# Patient Record
Sex: Male | Born: 1940 | Race: White | Hispanic: No | Marital: Married | Smoking: Former smoker
Health system: Southern US, Community
[De-identification: ages and names within clinical notes are randomized; demographics above are authoritative.]

## PROBLEM LIST (undated history)

## (undated) DIAGNOSIS — E785 Hyperlipidemia, unspecified: Secondary | ICD-10-CM

## (undated) DIAGNOSIS — Z87442 Personal history of urinary calculi: Secondary | ICD-10-CM

## (undated) DIAGNOSIS — H409 Unspecified glaucoma: Secondary | ICD-10-CM

## (undated) DIAGNOSIS — Z9889 Other specified postprocedural states: Secondary | ICD-10-CM

## (undated) DIAGNOSIS — I714 Abdominal aortic aneurysm, without rupture, unspecified: Secondary | ICD-10-CM

## (undated) DIAGNOSIS — R112 Nausea with vomiting, unspecified: Secondary | ICD-10-CM

## (undated) DIAGNOSIS — Z974 Presence of external hearing-aid: Secondary | ICD-10-CM

## (undated) DIAGNOSIS — H269 Unspecified cataract: Secondary | ICD-10-CM

## (undated) DIAGNOSIS — H9190 Unspecified hearing loss, unspecified ear: Secondary | ICD-10-CM

## (undated) DIAGNOSIS — I729 Aneurysm of unspecified site: Secondary | ICD-10-CM

## (undated) HISTORY — DX: Unspecified hearing loss, unspecified ear: H91.90

## (undated) HISTORY — DX: Abdominal aortic aneurysm, without rupture, unspecified: I71.40

## (undated) HISTORY — PX: SHOULDER ARTHROSCOPY: SHX128

## (undated) HISTORY — DX: Unspecified glaucoma: H40.9

## (undated) HISTORY — DX: Hyperlipidemia, unspecified: E78.5

## (undated) HISTORY — DX: Aneurysm of unspecified site: I72.9

## (undated) HISTORY — PX: INGUINAL HERNIA REPAIR: SUR1180

## (undated) HISTORY — DX: Unspecified cataract: H26.9

---

## 1978-01-29 HISTORY — PX: INGUINAL HERNIA REPAIR: SUR1180

## 2005-01-29 HISTORY — PX: JOINT REPLACEMENT: SHX530

## 2005-08-29 ENCOUNTER — Emergency Department: Payer: Self-pay | Admitting: Emergency Medicine

## 2007-12-24 ENCOUNTER — Ambulatory Visit: Payer: Self-pay | Admitting: Urology

## 2007-12-29 ENCOUNTER — Ambulatory Visit: Payer: Self-pay | Admitting: Urology

## 2008-01-08 ENCOUNTER — Ambulatory Visit: Payer: Self-pay | Admitting: Urology

## 2008-03-03 ENCOUNTER — Ambulatory Visit: Payer: Self-pay | Admitting: Urology

## 2008-04-07 ENCOUNTER — Ambulatory Visit: Payer: Self-pay | Admitting: Urology

## 2009-10-09 IMAGING — CR DG ABDOMEN 1V
1 series · 1 of 1 positions shown · non-contrast
Comparison: none

REASON FOR EXAM: pre eswl flank pain right
COMMENTS:

[view not recorded]
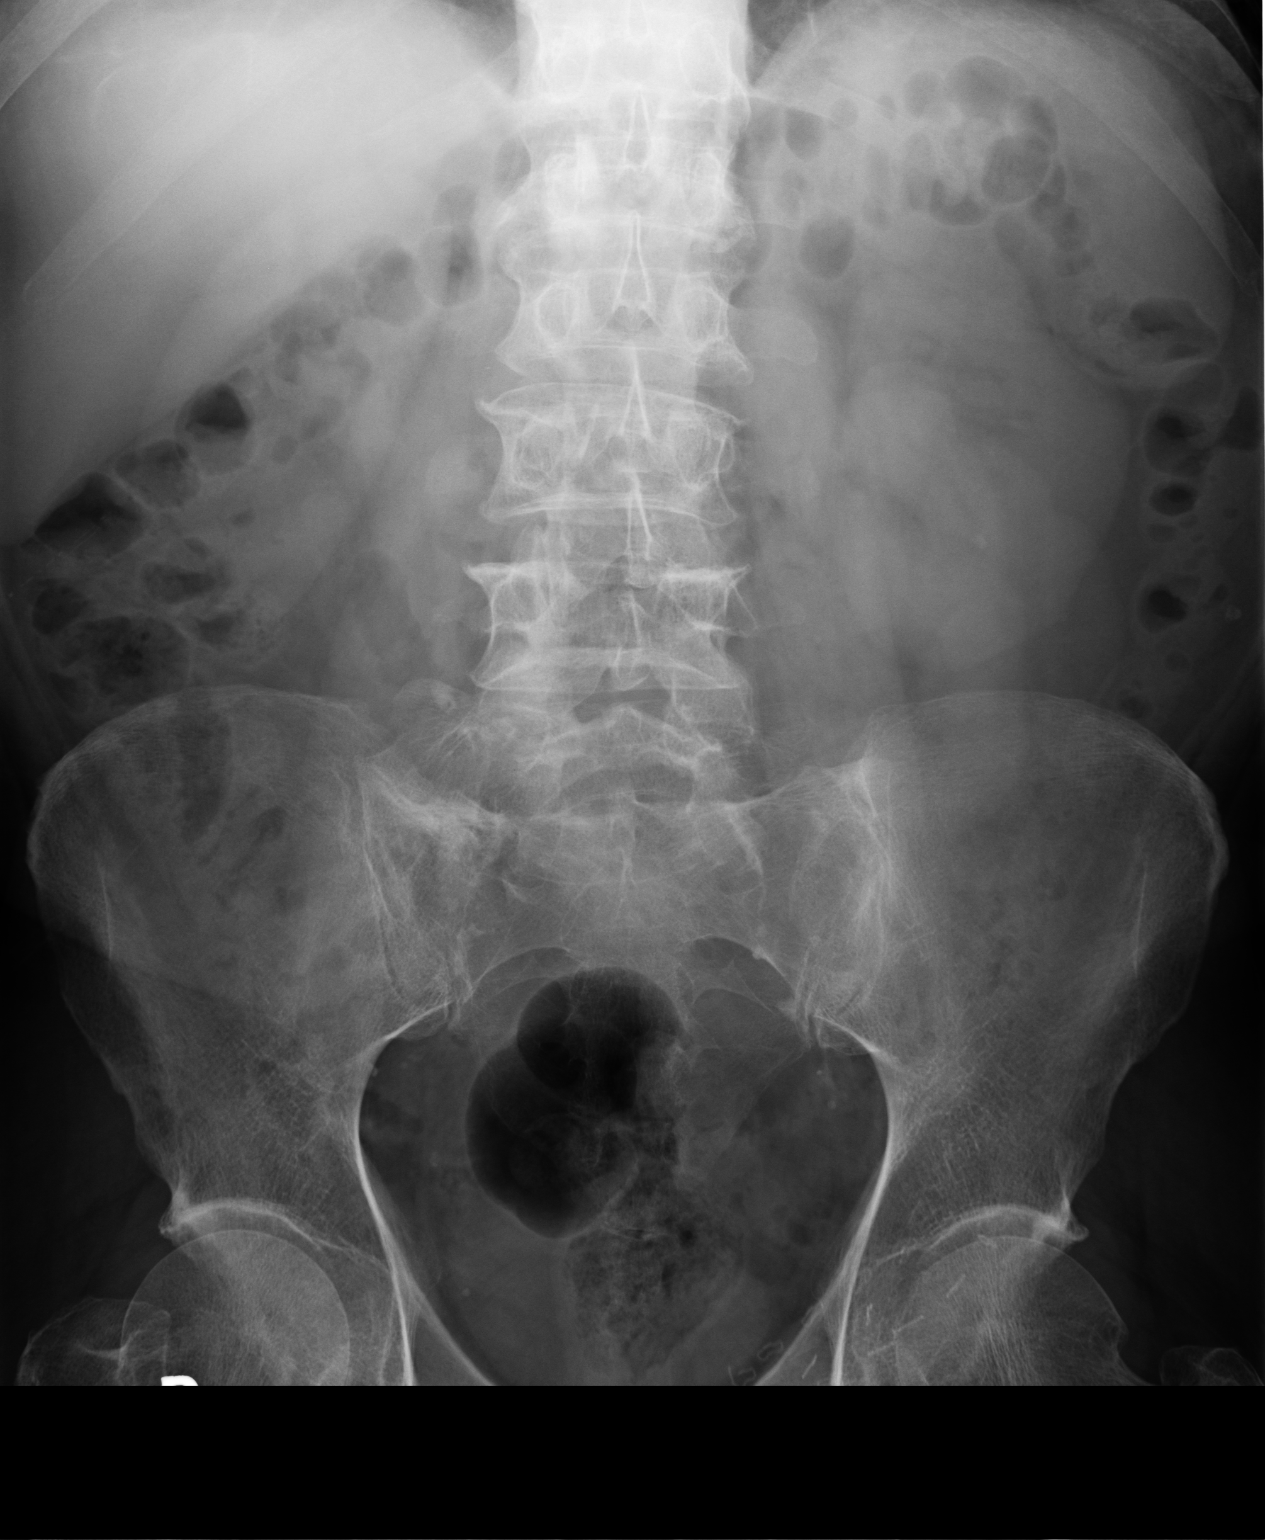

[1 of 1 positions shown; findings below may reference images not displayed]

PROCEDURE:     DXR - DXR KIDNEY URETER BLADDER  - January 08, 2008 [DATE]

RESULT:     Comparison is made to prior exam of 12/29/2007. The current exam
shows a tiny density projected over the lower pole of the LEFT kidney
compatible a LEFT renal stone. On the RIGHT, there is a 7 mm density
projected lateral to the L5 lumbar vertebral body on the RIGHT and
consistent with a persistent RIGHT ureteral stone. No other ureteral stones
are seen. There are possibly 1 or 2 tiny calcifications of the RIGHT kidney
but these are better seen at prior CT.
IMPRESSION: 1. Persistent density lateral to the L5 lumbar vertebral body on the RIGHT
and consistent with a RIGHT ureteral stone.
2. LEFT nephrolithiasis.
3. Possible RIGHT nephrolithiasis.

## 2010-12-13 ENCOUNTER — Ambulatory Visit: Payer: Self-pay | Admitting: Internal Medicine

## 2011-01-30 HISTORY — PX: EYE SURGERY: SHX253

## 2011-10-26 ENCOUNTER — Ambulatory Visit: Payer: Self-pay | Admitting: Internal Medicine

## 2012-04-02 ENCOUNTER — Ambulatory Visit: Payer: Self-pay | Admitting: Surgery

## 2012-04-02 LAB — CREATININE, SERUM
Creatinine: 0.98 mg/dL (ref 0.60–1.30)
EGFR (Non-African Amer.): >60

## 2013-05-08 ENCOUNTER — Ambulatory Visit: Payer: Self-pay | Admitting: Internal Medicine

## 2014-07-29 ENCOUNTER — Encounter: Payer: Self-pay | Admitting: Surgery

## 2014-07-29 ENCOUNTER — Ambulatory Visit (INDEPENDENT_AMBULATORY_CARE_PROVIDER_SITE_OTHER): Payer: Medicare HMO | Admitting: Surgery

## 2014-07-29 VITALS — BP 145/82 | HR 85 | Temp 98.2°F | Ht 68.0 in | Wt 180.2 lb

## 2014-07-29 DIAGNOSIS — K4090 Unilateral inguinal hernia, without obstruction or gangrene, not specified as recurrent: Secondary | ICD-10-CM | POA: Diagnosis not present

## 2014-07-29 NOTE — Patient Instructions (Signed)
You have requested to have your Right Hernia Repaired. We will need to have medical clearance completed by your PCP (Dr. Maryellen PileEason) prior to being scheduled. We will call you with appointment information.  After clearance has been obtained, our office will call you with your surgery and pre-op appointment information.

## 2014-07-29 NOTE — Progress Notes (Deleted)
Subjective:     Patient ID: Jason Reese, male   DOB: 01/14/1941, 74 y.o.   MRN: [id number]  HPI   Review of Systems     Objective:   Physical Exam     Assessment:     ***    Plan:     ***

## 2014-07-29 NOTE — H&P (Signed)
CC: Right groin pain x 8 months HPI: [name] is a pleasant 74 yo M who presents with an 8 month worsening right groin pain and swelling.  Has had a hernia there for years.  History of LIH repair x 2 in past.  Nonreducible due to significant size of hernia.  Otherwise no fevers/chills, night sweats, shortness of breath, cough, chest pain, abdominal pain, nausea/vomiting, diarrhea/constipation, dysuria/hematuria.  H/o AAA, unsure who is following.    Active Ambulatory Problems    Diagnosis Date Noted  . Inguinal hernia 07/29/2014   Resolved Ambulatory Problems    Diagnosis Date Noted  . No Resolved Ambulatory Problems   Past Medical History  Diagnosis Date  . Aneurysm   . Hyperlipidemia   . Glaucoma   . Cataract   . Hard of hearing    No Known Allergies     Medication List       This list is accurate as of: 07/29/14 11:40 AM.  Always use your most recent med list.               AZOPT 1 % ophthalmic suspension  Generic drug:  brinzolamide  Apply 1 drop to eye 2 (two) times daily.     latanoprost 0.005 % ophthalmic solution  Commonly known as:  XALATAN  Place 1 drop into both eyes at bedtime.     lovastatin 20 MG tablet  Commonly known as:  MEVACOR  Take 1 tablet by mouth daily.       History   Social History  . Marital Status: Married    Spouse Name: N/A  . Number of Children: N/A  . Years of Education: N/A   Occupational History  . Not on file.   Social History Main Topics  . Smoking status: Former Smoker    Quit date: 07/28/2005  . Smokeless tobacco: Never Used  . Alcohol Use: No  . Drug Use: No  . Sexual Activity: Not on file   Other Topics Concern  . Not on file   Social History Narrative  . No narrative on file   Family History  Problem Relation Age of Onset  . Diabetes Mother    Blood pressure 145/82, pulse 85, temperature 98.2 F (36.8 C), temperature source Oral, height 5\' 8"  (1.727 m), weight 180 lb 3.2 oz (81.738 kg). GEN:  NAD/A&Ox3 FACE: no obvious facial trauma, normal external nose, normal external ears EYES: no scleral icterus, no conjunctivitis HEAD: normocephalic atraumatic CV: RRR, no MRG RESP: moving air well, lungs clear ABD: soft, nontender, nondistended GROIN: Large, nonreducible right inguinal hernia extending into right groin EXT: moving all ext well, strength 5/5  Lab: no new labs Imaging: no new imaging  A/P 74 yo M who presents with worsening, increasingly painful RIH.  Would like it repaired.  Likely too large to repair laparoscopically, will plan on open RIH repair.  I have discussed the risks and benefits of surgery including possible testicular loss and he would like to proceed.  Will obtain PCP clearance with regards to AAA

## 2014-07-29 NOTE — Progress Notes (Signed)
CC: Right groin pain x 8 months HPI: [name] is a pleasant 74 yo M who presents with an 8 month worsening right groin pain and swelling.  Has had a hernia there for years.  History of LIH repair x 2 in past.  Nonreducible due to significant size of hernia.  Otherwise no fevers/chills, night sweats, shortness of breath, cough, chest pain, abdominal pain, nausea/vomiting, diarrhea/constipation, dysuria/hematuria.  H/o AAA, unsure who is following.    Active Ambulatory Problems    Diagnosis Date Noted  . Inguinal hernia 07/29/2014   Resolved Ambulatory Problems    Diagnosis Date Noted  . No Resolved Ambulatory Problems   Past Medical History  Diagnosis Date  . Aneurysm   . Hyperlipidemia   . Glaucoma   . Cataract   . Hard of hearing    No Known Allergies     Medication List       This list is accurate as of: 07/29/14 11:40 AM.  Always use your most recent med list.               AZOPT 1 % ophthalmic suspension  Generic drug:  brinzolamide  Apply 1 drop to eye 2 (two) times daily.     latanoprost 0.005 % ophthalmic solution  Commonly known as:  XALATAN  Place 1 drop into both eyes at bedtime.     lovastatin 20 MG tablet  Commonly known as:  MEVACOR  Take 1 tablet by mouth daily.       History   Social History  . Marital Status: Married    Spouse Name: N/A  . Number of Children: N/A  . Years of Education: N/A   Occupational History  . Not on file.   Social History Main Topics  . Smoking status: Former Smoker    Quit date: 07/28/2005  . Smokeless tobacco: Never Used  . Alcohol Use: No  . Drug Use: No  . Sexual Activity: Not on file   Other Topics Concern  . Not on file   Social History Narrative  . No narrative on file   Family History  Problem Relation Age of Onset  . Diabetes Mother    Blood pressure 145/82, pulse 85, temperature 98.2 F (36.8 C), temperature source Oral, height 5\' 8"  (1.727 m), weight 180 lb 3.2 oz (81.738 kg). GEN:  NAD/A&Ox3 FACE: no obvious facial trauma, normal external nose, normal external ears EYES: no scleral icterus, no conjunctivitis HEAD: normocephalic atraumatic CV: RRR, no MRG RESP: moving air well, lungs clear ABD: soft, nontender, nondistended GROIN: Large, nonreducible right inguinal hernia extending into right groin EXT: moving all ext well, strength 5/5  Lab: no new labs Imaging: no new imaging  A/P 74 yo M who presents with worsening, increasingly painful RIH.  Would like it repaired.  Likely too large to repair laparoscopically, will plan on open RIH repair.  I have discussed the risks and benefits of surgery including possible testicular loss and he would like to proceed. NEURO: cnII-XII grossly intact, sensation intact all 4 ext

## 2014-08-11 ENCOUNTER — Encounter: Payer: Self-pay | Admitting: Surgery

## 2014-08-12 ENCOUNTER — Telehealth: Payer: Self-pay | Admitting: Surgery

## 2014-08-12 NOTE — Telephone Encounter (Signed)
FYI:  I put a Medical Clearance for Surgery to the pt chart under Media from Dr. Maryellen PileEason.

## 2014-08-13 ENCOUNTER — Encounter: Payer: Self-pay | Admitting: Surgery

## 2014-08-18 ENCOUNTER — Ambulatory Visit
Admission: RE | Admit: 2014-08-18 | Discharge: 2014-08-18 | Disposition: A | Discharge status: Home / Self Care | Payer: Medicare HMO | Source: Ambulatory Visit | Attending: Surgery | Admitting: Surgery

## 2014-08-18 ENCOUNTER — Encounter
Admission: RE | Admit: 2014-08-18 | Discharge: 2014-08-18 | Disposition: A | Discharge status: Home / Self Care | Payer: Medicare HMO | Source: Ambulatory Visit | Attending: Surgery | Admitting: Surgery

## 2014-08-18 DIAGNOSIS — Z01818 Encounter for other preprocedural examination: Secondary | ICD-10-CM | POA: Diagnosis not present

## 2014-08-18 DIAGNOSIS — K449 Diaphragmatic hernia without obstruction or gangrene: Secondary | ICD-10-CM | POA: Insufficient documentation

## 2014-08-18 HISTORY — DX: Other specified postprocedural states: R11.2

## 2014-08-18 HISTORY — DX: Other specified postprocedural states: Z98.890

## 2014-08-18 LAB — COMPREHENSIVE METABOLIC PANEL
ALT: 13 U/L — ABNORMAL LOW (ref 17–63)
AST: 18 U/L (ref 15–41)
Albumin: 4.1 g/dL (ref 3.5–5.0)
Alkaline Phosphatase: 79 U/L (ref 38–126)
Anion gap: 8 (ref 5–15)
BILIRUBIN TOTAL: 0.7 mg/dL (ref 0.3–1.2)
BUN: 15 mg/dL (ref 6–20)
CALCIUM: 9.1 mg/dL (ref 8.9–10.3)
CHLORIDE: 110 mmol/L (ref 101–111)
CO2: 25 mmol/L (ref 22–32)
CREATININE: 0.73 mg/dL (ref 0.61–1.24)
GFR calc non Af Amer: >60 mL/min (ref >60)
Glucose, Bld: 102 mg/dL — ABNORMAL HIGH (ref 65–99)
POTASSIUM: 3.8 mmol/L (ref 3.5–5.1)
Sodium: 143 mmol/L (ref 135–145)
Total Protein: 7.1 g/dL (ref 6.5–8.1)

## 2014-08-18 LAB — CBC WITH DIFFERENTIAL/PLATELET
Basophils Absolute: 0.1 10*3/uL (ref 0–0.1)
Basophils Relative: 1 %
Eosinophils Absolute: 0.3 10*3/uL (ref 0–0.7)
Eosinophils Relative: 5 %
HCT: 44 % (ref 40.0–52.0)
HEMOGLOBIN: 14.2 g/dL (ref 13.0–18.0)
Lymphocytes Relative: 14 %
Lymphs Abs: 1 10*3/uL (ref 1.0–3.6)
MCH: 26.3 pg (ref 26.0–34.0)
MCHC: 32.2 g/dL (ref 32.0–36.0)
MCV: 81.6 fL (ref 80.0–100.0)
MONO ABS: 0.5 10*3/uL (ref 0.2–1.0)
Monocytes Relative: 7 %
Neutro Abs: 5.3 10*3/uL (ref 1.4–6.5)
Neutrophils Relative %: 73 %
Platelets: 147 10*3/uL — ABNORMAL LOW (ref 150–440)
RBC: 5.39 MIL/uL (ref 4.40–5.90)
RDW: 15.3 % — ABNORMAL HIGH (ref 11.5–14.5)
WBC: 7.2 10*3/uL (ref 3.8–10.6)

## 2014-08-18 NOTE — Telephone Encounter (Signed)
Auth obtained for CPT: 49650 eff date:7/20- 8/25 REF# [id number] Per: Francine GravenHUMANA

## 2014-08-18 NOTE — Patient Instructions (Signed)
  Your procedure is scheduled on: Wednesday August 25, 2014 Report to Same Day Surgery. To find out your arrival time please call [phone] between 1PM - 3PM on Tuesday August 24, 2014.  Remember: Instructions that are not followed completely may result in serious medical risk, up to and including death, or upon the discretion of your surgeon and anesthesiologist your surgery may need to be rescheduled.    __x__ 1. Do not eat food or drink liquids after midnight. No gum chewing or hard candies.     ____ 2. No Alcohol for 24 hours before or after surgery.   ____ 3. Bring all medications with you on the day of surgery if instructed.    __x__ 4. Notify your doctor if there is any change in your medical condition     (cold, fever, infections).     Do not wear jewelry, make-up, hairpins, clips or nail polish.  Do not wear lotions, powders, or perfumes. You may wear deodorant.  Do not shave 48 hours prior to surgery. Men may shave face and neck.  Do not bring valuables to the hospital.    [facility] is not responsible for any belongings or valuables.               Contacts, dentures or bridgework may not be worn into surgery.  Leave your suitcase in the car. After surgery it may be brought to your room.  For patients admitted to the hospital, discharge time is determined by your  treatment team.   Patients discharged the day of surgery will not be allowed to drive home.    Please read over the following fact sheets that you were given:   [facility] Pre[facility]  __x__ Take these medicines the morning of surgery with A SIP OF WATER:    1. lovastatin (MEVACOR)     ____ Fleet Enema (as directed)   _x___ Use CHG Soap as directed  ____ Use inhalers on the day of surgery  ____ Stop metformin 2 days prior to surgery    ____ Take 1/2 of usual insulin dose the night before surgery and none on the morning of surgery.   _x___ Stop B C Powders (aspirin) on today, Tylenol is OK  to take for pain.  ____ Stop Anti-inflammatories on does not apply.  ____ Stop supplements until after surgery.    ____ Bring C-Pap to the hospital.

## 2014-08-25 ENCOUNTER — Encounter: Payer: Self-pay | Admitting: Anesthesiology

## 2014-08-25 ENCOUNTER — Ambulatory Visit: Payer: Medicare HMO | Admitting: Anesthesiology

## 2014-08-25 ENCOUNTER — Ambulatory Visit
Admission: RE | Admit: 2014-08-25 | Discharge: 2014-08-25 | Disposition: A | Discharge status: Home / Self Care | Payer: Medicare HMO | Source: Ambulatory Visit | Attending: Surgery | Admitting: Surgery

## 2014-08-25 ENCOUNTER — Encounter
Admission: RE | Disposition: A | Discharge status: Home / Self Care | Payer: Self-pay | Source: Ambulatory Visit | Attending: Surgery

## 2014-08-25 DIAGNOSIS — K4090 Unilateral inguinal hernia, without obstruction or gangrene, not specified as recurrent: Secondary | ICD-10-CM | POA: Insufficient documentation

## 2014-08-25 DIAGNOSIS — H409 Unspecified glaucoma: Secondary | ICD-10-CM | POA: Insufficient documentation

## 2014-08-25 DIAGNOSIS — Z833 Family history of diabetes mellitus: Secondary | ICD-10-CM | POA: Diagnosis not present

## 2014-08-25 DIAGNOSIS — Z79899 Other long term (current) drug therapy: Secondary | ICD-10-CM | POA: Insufficient documentation

## 2014-08-25 DIAGNOSIS — Z87891 Personal history of nicotine dependence: Secondary | ICD-10-CM | POA: Diagnosis not present

## 2014-08-25 DIAGNOSIS — E785 Hyperlipidemia, unspecified: Secondary | ICD-10-CM | POA: Insufficient documentation

## 2014-08-25 DIAGNOSIS — H9190 Unspecified hearing loss, unspecified ear: Secondary | ICD-10-CM | POA: Insufficient documentation

## 2014-08-25 HISTORY — PX: INGUINAL HERNIA REPAIR: SHX194

## 2014-08-25 SURGERY — REPAIR, HERNIA, INGUINAL, ADULT
Anesthesia: General | Laterality: Right | Wound class: Clean

## 2014-08-25 MED ORDER — ESMOLOL HCL 10 MG/ML IV SOLN
INTRAVENOUS | Status: DC | PRN
  Administered 2014-08-25: 20 mg via INTRAVENOUS

## 2014-08-25 MED ORDER — LIDOCAINE-EPINEPHRINE 1 %-1:100000 IJ SOLN
INTRAMUSCULAR | Status: AC
  Filled 2014-08-25: qty 1

## 2014-08-25 MED ORDER — SODIUM CHLORIDE 0.9 % IJ SOLN
INTRAMUSCULAR | Status: AC
  Filled 2014-08-25: qty 10

## 2014-08-25 MED ORDER — MIDAZOLAM HCL 2 MG/2ML IJ SOLN
INTRAMUSCULAR | Status: DC | PRN
  Administered 2014-08-25: 2 mg via INTRAVENOUS

## 2014-08-25 MED ORDER — ONDANSETRON HCL 4 MG/2ML IJ SOLN
INTRAMUSCULAR | Status: DC | PRN
  Administered 2014-08-25: 4 mg via INTRAVENOUS

## 2014-08-25 MED ORDER — ACETAMINOPHEN 10 MG/ML IV SOLN
INTRAVENOUS | Status: DC | PRN
  Administered 2014-08-25: 1000 mg via INTRAVENOUS

## 2014-08-25 MED ORDER — PROMETHAZINE HCL 25 MG/ML IJ SOLN
INTRAMUSCULAR | Status: AC
  Administered 2014-08-25: 6.25 mg via INTRAVENOUS
  Filled 2014-08-25: qty 1

## 2014-08-25 MED ORDER — FENTANYL CITRATE (PF) 100 MCG/2ML IJ SOLN
INTRAMUSCULAR | Status: AC
  Administered 2014-08-25: 25 ug via INTRAVENOUS
  Filled 2014-08-25: qty 2

## 2014-08-25 MED ORDER — ACETAMINOPHEN 10 MG/ML IV SOLN
INTRAVENOUS | Status: AC
  Filled 2014-08-25: qty 100

## 2014-08-25 MED ORDER — GLYCOPYRROLATE 0.2 MG/ML IJ SOLN
INTRAMUSCULAR | Status: DC | PRN
Start: 2014-08-25 — End: 2014-08-25
  Administered 2014-08-25: .8 mg via INTRAVENOUS
  Administered 2014-08-25: .2 mg via INTRAVENOUS

## 2014-08-25 MED ORDER — OXYCODONE HCL 5 MG/5ML PO SOLN: 5.0000 mg | Freq: Once | ORAL | Status: DC | PRN

## 2014-08-25 MED ORDER — FAMOTIDINE 20 MG PO TABS
20.0000 mg | ORAL_TABLET | Freq: Once | ORAL | Status: AC
  Administered 2014-08-25: 20 mg via ORAL

## 2014-08-25 MED ORDER — BACITRACIN 50000 UNITS IM SOLR
INTRAMUSCULAR | Status: AC
  Filled 2014-08-25: qty 1

## 2014-08-25 MED ORDER — PROMETHAZINE HCL 25 MG/ML IJ SOLN
6.2500 mg | Freq: Once | INTRAMUSCULAR | Status: AC
  Administered 2014-08-25: 6.25 mg via INTRAVENOUS

## 2014-08-25 MED ORDER — ROCURONIUM BROMIDE 100 MG/10ML IV SOLN
INTRAVENOUS | Status: DC | PRN
  Administered 2014-08-25: 10 mg via INTRAVENOUS
  Administered 2014-08-25: 40 mg via INTRAVENOUS
  Administered 2014-08-25 (×2): 10 mg via INTRAVENOUS

## 2014-08-25 MED ORDER — CEFOXITIN SODIUM-DEXTROSE 2-2.2 GM-% IV SOLR (PREMIX)
INTRAVENOUS | Status: AC
  Administered 2014-08-25: 2 g via INTRAVENOUS
  Filled 2014-08-25: qty 50

## 2014-08-25 MED ORDER — FENTANYL CITRATE (PF) 100 MCG/2ML IJ SOLN
INTRAMUSCULAR | Status: DC | PRN
  Administered 2014-08-25: 50 ug via INTRAVENOUS
  Administered 2014-08-25: 100 ug via INTRAVENOUS
  Administered 2014-08-25: 50 ug via INTRAVENOUS

## 2014-08-25 MED ORDER — PROPOFOL 10 MG/ML IV BOLUS: INTRAVENOUS | Status: DC | PRN

## 2014-08-25 MED ORDER — LACTATED RINGERS IV SOLN
INTRAVENOUS | Status: DC
Start: 2014-08-25 — End: 2014-08-25
  Administered 2014-08-25 (×2): via INTRAVENOUS

## 2014-08-25 MED ORDER — CEFOXITIN SODIUM-DEXTROSE 2-2.2 GM-% IV SOLR (PREMIX)
2.0000 g | INTRAVENOUS | Status: AC
  Administered 2014-08-25: 2000 mg via INTRAVENOUS

## 2014-08-25 MED ORDER — OXYCODONE HCL 5 MG PO TABS: 5.0000 mg | ORAL_TABLET | Freq: Once | ORAL | Status: DC | PRN

## 2014-08-25 MED ORDER — ONDANSETRON HCL 4 MG/2ML IJ SOLN
INTRAMUSCULAR | Status: AC
  Administered 2014-08-25: 4 mg via INTRAVENOUS
  Filled 2014-08-25: qty 2

## 2014-08-25 MED ORDER — FAMOTIDINE 20 MG PO TABS
ORAL_TABLET | ORAL | Status: AC
  Administered 2014-08-25: 20 mg via ORAL
  Filled 2014-08-25: qty 1

## 2014-08-25 MED ORDER — EPHEDRINE SULFATE 50 MG/ML IJ SOLN
INTRAMUSCULAR | Status: DC | PRN
  Administered 2014-08-25 (×2): 10 mg via INTRAVENOUS

## 2014-08-25 MED ORDER — FENTANYL CITRATE (PF) 100 MCG/2ML IJ SOLN
25.0000 ug | INTRAMUSCULAR | Status: DC | PRN
  Administered 2014-08-25 (×3): 25 ug via INTRAVENOUS

## 2014-08-25 MED ORDER — DOCUSATE SODIUM 100 MG PO CAPS: 100.0000 mg | ORAL_CAPSULE | Freq: Two times a day (BID) | ORAL | Status: DC

## 2014-08-25 MED ORDER — NEOSTIGMINE METHYLSULFATE 10 MG/10ML IV SOLN
INTRAVENOUS | Status: DC | PRN
  Administered 2014-08-25: 5 mg via INTRAVENOUS

## 2014-08-25 MED ORDER — LIDOCAINE HCL (CARDIAC) 20 MG/ML IV SOLN
INTRAVENOUS | Status: DC | PRN
  Administered 2014-08-25: 100 mg via INTRAVENOUS
  Administered 2014-08-25: 30 mg via INTRAVENOUS

## 2014-08-25 MED ORDER — PROPOFOL 10 MG/ML IV BOLUS
INTRAVENOUS | Status: DC | PRN
  Administered 2014-08-25: 160 mg via INTRAVENOUS

## 2014-08-25 MED ORDER — LIDOCAINE-EPINEPHRINE 1 %-1:100000 IJ SOLN
INTRAMUSCULAR | Status: DC | PRN
  Administered 2014-08-25: 18 mL via INTRADERMAL

## 2014-08-25 MED ORDER — ONDANSETRON HCL 4 MG/2ML IJ SOLN
4.0000 mg | Freq: Once | INTRAMUSCULAR | Status: AC
  Administered 2014-08-25: 4 mg via INTRAVENOUS

## 2014-08-25 MED ORDER — HYDROCODONE-ACETAMINOPHEN 5-325 MG PO TABS: 1.0000 | ORAL_TABLET | ORAL | Status: DC | PRN

## 2014-08-25 SURGICAL SUPPLY — 40 items
BENZOIN TINCTURE PRP APPL 2/3 (GAUZE/BANDAGES/DRESSINGS) ×3 IMPLANT
BLADE SURG 15 STRL LF DISP TIS (BLADE) ×1 IMPLANT
BLADE SURG 15 STRL SS (BLADE) ×2
CANISTER SUCT 1200ML W/VALVE (MISCELLANEOUS) ×3 IMPLANT
CATH TRAY 16F METER LATEX (MISCELLANEOUS) ×3 IMPLANT
CHLORAPREP W/TINT 26ML (MISCELLANEOUS) ×3 IMPLANT
CLOSURE WOUND 1/2 X4 (GAUZE/BANDAGES/DRESSINGS) ×1
DRAIN PENROSE 5/8X12 LTX STRL (DRAIN) IMPLANT
DRAIN PENROSE 5/8X18 LTX STRL (WOUND CARE) ×3 IMPLANT
DRAPE LAPAROTOMY 100X77 ABD (DRAPES) ×3 IMPLANT
DRESSING TELFA 4X3 1S ST N-ADH (GAUZE/BANDAGES/DRESSINGS) ×3 IMPLANT
DRSG TEGADERM 2-3/8X2-3/4 SM (GAUZE/BANDAGES/DRESSINGS) ×3 IMPLANT
DRSG TEGADERM 4X4.75 (GAUZE/BANDAGES/DRESSINGS) ×3 IMPLANT
DRSG TELFA 3X8 NADH (GAUZE/BANDAGES/DRESSINGS) ×3 IMPLANT
ELECT CAUTERY BLADE 6.4 (BLADE) ×3 IMPLANT
GLOVE BIO SURGEON STRL SZ7.5 (GLOVE) ×21 IMPLANT
GOWN STRL REUS W/ TWL LRG LVL3 (GOWN DISPOSABLE) ×4 IMPLANT
GOWN STRL REUS W/TWL LRG LVL3 (GOWN DISPOSABLE) ×8
LABEL OR SOLS (LABEL) IMPLANT
LARGE KEYHOLE MESH ×3 IMPLANT
MESH HERNIA 6X13 (Mesh General) ×2 IMPLANT
NDL SAFETY 25GX1.5 (NEEDLE) ×3 IMPLANT
NS IRRIG 500ML POUR BTL (IV SOLUTION) ×3 IMPLANT
PACK BASIN MINOR ARMC (MISCELLANEOUS) ×3 IMPLANT
PAD GROUND ADULT SPLIT (MISCELLANEOUS) ×3 IMPLANT
SPONGE KITTNER 5P (MISCELLANEOUS) ×3 IMPLANT
STRAP SAFETY BODY (MISCELLANEOUS) IMPLANT
STRIP CLOSURE SKIN 1/2X4 (GAUZE/BANDAGES/DRESSINGS) ×2 IMPLANT
SUT ETHIBOND 0 MO6 C/R (SUTURE) ×6 IMPLANT
SUT MNCRL 4-0 (SUTURE) ×2
SUT MNCRL 4-0 27XMFL (SUTURE) ×1
SUT SILK 3 0 (SUTURE) ×2
SUT SILK 3 0 SH 30 (SUTURE) ×6 IMPLANT
SUT SILK 3-0 18XBRD TIE 12 (SUTURE) ×1 IMPLANT
SUT VIC AB 3-0 SH 27 (SUTURE) ×8
SUT VIC AB 3-0 SH 27X BRD (SUTURE) ×4 IMPLANT
SUTURE MNCRL 4-0 27XMF (SUTURE) ×1 IMPLANT
SWABSTK COMLB BENZOIN TINCTURE (MISCELLANEOUS) ×3 IMPLANT
SYR BULB IRRIG 60ML STRL (SYRINGE) ×3 IMPLANT
SYRINGE 10CC LL (SYRINGE) ×3 IMPLANT

## 2014-08-25 NOTE — Anesthesia Preprocedure Evaluation (Addendum)
Anesthesia Evaluation  Patient identified by MRN, date of birth, ID band Patient awake    Reviewed: Allergy & Precautions, H&P , NPO status , Patient's Chart, lab work & pertinent test results, reviewed documented beta blocker date and time   History of Anesthesia Complications (+) PONV and history of anesthetic complications  Airway Mallampati: III  TM Distance: >3 FB Neck ROM: limited    Dental  (+) Chipped, Missing, Poor Dentition   Pulmonary former smoker,  breath sounds clear to auscultation  Pulmonary exam normal       Cardiovascular Exercise Tolerance: Good Normal cardiovascular examRhythm:regular Rate:Normal     Neuro/Psych negative neurological ROS  negative psych ROS   GI/Hepatic negative GI ROS, Neg liver ROS,   Endo/Other  negative endocrine ROS  Renal/GU negative Renal ROS  negative genitourinary   Musculoskeletal   Abdominal   Peds  Hematology negative hematology ROS (+)   Anesthesia Other Findings Past Medical History:   Aneurysm                                                     Hyperlipidemia                                               Glaucoma                                                     Cataract                                                     Hard of hearing                                              PONV (postoperative nausea and vomiting)    Patient has medical clearance for this procedure.                   Reproductive/Obstetrics negative OB ROS                            Anesthesia Physical Anesthesia Plan  ASA: III  Anesthesia Plan: General LMA   Post-op Pain Management:    Induction:   Airway Management Planned:   Additional Equipment:   Intra-op Plan:   Post-operative Plan:   Informed Consent: I have reviewed the patients History and Physical, chart, labs and discussed the procedure including the risks, benefits and alternatives  for the proposed anesthesia with the patient or authorized representative who has indicated his/her understanding and acceptance.   Dental Advisory Given  Plan Discussed with: Anesthesiologist, CRNA and Surgeon  Anesthesia Plan Comments:        Anesthesia Quick Evaluation

## 2014-08-25 NOTE — Anesthesia Procedure Notes (Signed)
Procedure Name: Intubation Date/Time: 08/25/2014 10:00 AM Performed by: Henrietta Hoover Pre-anesthesia Checklist: Patient identified, Emergency Drugs available, Suction available, Timeout performed and Patient being monitored Patient Re-evaluated:Patient Re-evaluated prior to inductionOxygen Delivery Method: Circle system utilized Preoxygenation: Pre-oxygenation with 100% oxygen Intubation Type: IV induction Laryngoscope Size: Mac and 3 Grade View: Grade I Tube type: Oral Number of attempts: 1 Airway Equipment and Method: Stylet Placement Confirmation: ETT inserted through vocal cords under direct vision,  positive ETCO2 and breath sounds checked- equal and bilateral Secured at: 21 cm Tube secured with: Tape Dental Injury: Teeth and Oropharynx as per pre-operative assessment

## 2014-08-25 NOTE — Anesthesia Postprocedure Evaluation (Signed)
  Anesthesia Post-op Note  Patient: Jason Reese  Procedure(s) Performed: Procedure(s): RIGHT INGUINAL HERNIA REPAIR WITH MESH  (Right)  Anesthesia type:General LMA  Patient location: PACU  Post pain: Pain level controlled  Post assessment: Post-op Vital signs reviewed, Patient's Cardiovascular Status Stable, Respiratory Function Stable, Patent Airway and No signs of Nausea or vomiting  Post vital signs: Reviewed and stable  Last Vitals:  Filed Vitals:   08/25/14 1405  BP: 161/72  Pulse: 66  Temp: 35.3 C  Resp: 16    Level of consciousness: awake, alert  and patient cooperative  Complications: No apparent anesthesia complications

## 2014-08-25 NOTE — Transfer of Care (Signed)
Immediate Anesthesia Transfer of Care Note  Patient: Jason Reese  Procedure(s) Performed: Procedure(s): RIGHT INGUINAL HERNIA REPAIR WITH MESH  (Right)  Patient Location: PACU  Anesthesia Type:General  Level of Consciousness: sedated  Airway & Oxygen Therapy: Patient Spontanous Breathing and Patient connected to face mask oxygen  Post-op Assessment: Report given to RN and Post -op Vital signs reviewed and stable  Post vital signs: Reviewed and stable  Last Vitals:  Filed Vitals:   08/25/14 0811  BP: 176/82  Pulse: 63  Temp: 36.6 C  Resp: 18    Complications: no complications

## 2014-08-25 NOTE — Discharge Instructions (Addendum)
Do not drive on pain medications Do not lift greater than 15 lbs for a period of 6 weeks Call or return to ER if you develop fever greater than 101.5, nausea/vomiting, increased pain, redness/drainage from incisions Take bandages off in 48 hours.  Okay to shower with bandages on or after they come off, no tub baths    AMBULATORY SURGERY  DISCHARGE INSTRUCTIONS   1) The drugs that you were given will stay in your system until tomorrow so for the next 24 hours you should not:  A) Drive an automobile B) Make any legal decisions C) Drink any alcoholic beverage   2) You may resume regular meals tomorrow.  Today it is better to start with liquids and gradually work up to solid foods.  You may eat anything you prefer, but it is better to start with liquids, then soup and crackers, and gradually work up to solid foods.   3) Please notify your doctor immediately if you have any unusual bleeding, trouble breathing, redness and pain at the surgery site, drainage, fever, or pain not relieved by medication. 4)   5) Your post-operative visit with Dr.                                     is: Date:                        Time:    Please call to schedule your post-operative visit.  6) Additional Instructions:     Hernia Repair Care After These instructions give you information on caring for yourself after your procedure. Your doctor may also give you more specific instructions. Call your doctor if you have any problems or questions after your procedure. HOME CARE   You may have changes in your poops (bowel movements).  You may have loose or watery poop (diarrhea).  You may be not able to poop.  Your bowels will slowly get back to normal.  Do not eat any food that makes you sick to your stomach (nauseous). Eat small meals 4 to 6 times a day instead of 3 large ones.  Do not drink pop. It will give you gas.  Do not drink alcohol.  Do not lift anything heavier than 10 pounds. This  is about the weight of a gallon of milk.  Do not do anything that makes you very tired for at least 6 weeks.  Do not get your wound wet for 2 days.  You may take a sponge bath during this time.  After 2 days you may take a shower. Gently pat your surgical cut (incision) dry with a towel. Do not rub it.  For men: You may have been given an athletic supporter (scrotal support) before you left the hospital. It holds your scrotum and testicles closer to your body so there is no strain on your wound. Wear the supporter until your doctor tells you that you do not need it anymore. GET HELP RIGHT AWAY IF:  You have watery poop, or cannot poop for more than 3 days.  You feel sick to your stomach or throw up (vomit) more than 2 or 3 times.  You have temperature by mouth above 102 F (38.9 C).  You see redness or puffiness (swelling) around your wound.  You see yellowish white fluid (pus) coming from your wound.  You see a bulge or bump  in your lower belly (abdomen) or near your groin.  You develop a rash, trouble breathing, or any other symptoms from medicines taken. MAKE SURE YOU:  Understand these instructions.  Will watch your condition.  Will get help right away if your are not doing well or get worse. Document Released: 12/29/2007 Document Revised: 04/09/2011 Document Reviewed: 12/29/2007 [facility] Patient Information 2015 [place], [place]. This information is not intended to replace advice given to you by your health care provider. Make sure you discuss any questions you have with your health care provider.

## 2014-08-25 NOTE — Progress Notes (Signed)
I have seen and evaluated Jason Reese.  No changes to H and P.  Proceed with surgery.

## 2014-08-25 NOTE — Brief Op Note (Signed)
08/25/2014  12:18 PM  PATIENT:  Jason Reese  74 y.o. male  PRE-OPERATIVE DIAGNOSIS:  Right inguinal hernia  POST-OPERATIVE DIAGNOSIS:  right inguinal hernia   PROCEDURE:  Procedure(s): RIGHT INGUINAL HERNIA REPAIR WITH MESH  (Right)  SURGEON:  Surgeon(s) and Role:    * Ida Rogue, MD - Primary    * Hulda Marin, MD - Assisting  PHYSICIAN ASSISTANT:   ASSISTANTS: none   ANESTHESIA:   general  EBL:  Total I/O In: 800 [I.V.:800] Out: 350 [Urine:350]  BLOOD ADMINISTERED:none  DRAINS: none   LOCAL MEDICATIONS USED:  LIDOCAINE   SPECIMEN:  No Specimen  DISPOSITION OF SPECIMEN:  N/A  COUNTS:  YES  TOURNIQUET:  * No tourniquets in log *  DICTATION: .Note written in EPIC  PLAN OF CARE: Discharge to home after PACU  PATIENT DISPOSITION:  PACU - hemodynamically stable.   Delay start of Pharmacological VTE agent (>24hrs) due to surgical blood loss or risk of bleeding: not applicable

## 2014-08-25 NOTE — Op Note (Signed)
Preoperative diagnosis: Reducible right inguinal hernia Postoperative diagnosis: Same Procedure performed: Open right inguinal hernia repair with mesh  Surgeon: Athens Lebeau Assistant: [place] Anesthesia: GETA EBL: 20 ml Specimens: None Complications: None  Indication for surgery: Mr. Cappella is a pleasant 74 yo M who presents with painful, reducible, large right inguinal hernia.  He was brought to the OR for repair of right inguinal hernia.  Details of procedure: Informed consent was obtained.  Patient was brought to the operating room and laid supine on the OR table.  He was induced, ETT was placed and general anesthesia was administered.  His right groin was prepped and draped in the standard surgical fashion.  A time out was performed correctly identifying patient name, operative site and procedure to be performed.  An incision was made approx 1 cm superior and lateral to the edge of his pubic tubercle.  It was deepened through scarpas and campers fascia to the external oblique aponeurosis.  The aponeurosis was incised lengthwise proximally and distally.  The sac and cord structures were encountered.  The sac and cord were dissected out.  The sac was high ligated with 3-0 silk suture and 3-0 silk tie.  It was returned into the abdomen.  A large piece of prolene keyhole mesh was sutured with a 0-ethibond U stitch to the pubic tubercle.  It was then attached with interrupted 0-ethibond to the shelving edge of the inguinal ligament and with u stitches to the conjoined tendon.  The internal ring was recreated using a 0-ethibond U stitch to the tails of the mesh.  The tails were then tucked under the aponeurosis.  The aponeurosis was then approximated using a running 3-0 vicryl suture and scarpas was reapproximated using a running 3-0 vicryl suture.  The skin was then closed in layers with a running 3-0 vicryl deep dermal suture and a 4-0 monocryl subcuticular.  The wound was then infiltrated with 1% lidocaine  with epinephrine.  A sterile dressing was then placed on the wound.  He was then awoken, extubated and brought to PACU.  There were no immediate complications.  Needle, sponge and instrument count was correct at the end of the procedure.

## 2014-08-27 ENCOUNTER — Encounter: Payer: Self-pay | Admitting: Surgery

## 2014-09-03 ENCOUNTER — Ambulatory Visit (INDEPENDENT_AMBULATORY_CARE_PROVIDER_SITE_OTHER): Payer: Medicare HMO | Admitting: Surgery

## 2014-09-03 ENCOUNTER — Encounter: Payer: Self-pay | Admitting: Surgery

## 2014-09-03 VITALS — BP 130/88 | HR 78 | Temp 97.6°F | Ht 68.0 in | Wt 179.0 lb

## 2014-09-03 DIAGNOSIS — Z09 Encounter for follow-up examination after completed treatment for conditions other than malignant neoplasm: Secondary | ICD-10-CM

## 2014-09-03 NOTE — Patient Instructions (Addendum)
Give [place] a call if you have any questions or concerns. We will see you in a month.  Remember to wait two more weeks to start driving.   No heavy lifting until I see you in a month.

## 2014-09-03 NOTE — Progress Notes (Signed)
Surgery Progress Note  S: No acute issues.  Tolerating diet.  Having good BM.  No pain.  No swelling O: Blood pressure 130/88, pulse 78, temperature 97.6 F (36.4 C), temperature source Oral, height  (1.727 m), weight 81.194 kg (179 lb). GEN: NAD/A&Ox3 GROIN:  + Swelling, + testicular swelling, nontender  A/P 74 yo s/p open IH repair, doing well - no heavy lifting x 6 weeks - f/u 1 month to ensure resolution of swelling, soreness

## 2014-10-05 DIAGNOSIS — Z8639 Personal history of other endocrine, nutritional and metabolic disease: Secondary | ICD-10-CM | POA: Insufficient documentation

## 2014-10-05 DIAGNOSIS — I719 Aortic aneurysm of unspecified site, without rupture: Secondary | ICD-10-CM | POA: Insufficient documentation

## 2014-10-06 ENCOUNTER — Encounter: Payer: Self-pay | Admitting: Surgery

## 2014-10-06 ENCOUNTER — Ambulatory Visit (INDEPENDENT_AMBULATORY_CARE_PROVIDER_SITE_OTHER): Payer: Medicare HMO | Admitting: Surgery

## 2014-10-06 VITALS — BP 154/82 | HR 73 | Temp 97.5°F | Ht 68.0 in | Wt 176.0 lb

## 2014-10-06 DIAGNOSIS — Z09 Encounter for follow-up examination after completed treatment for conditions other than malignant neoplasm: Secondary | ICD-10-CM

## 2014-10-06 NOTE — Progress Notes (Signed)
Surgery Clinic Note  S: Min pain, off of pain meds.  Good PO, regular BM.  No recurrent swelling O:Blood pressure 154/82, pulse 73, temperature 97.5 F (36.4 C), temperature source Oral, height  (1.727 m), weight 176 lb (79.833 kg). GEN: NAD/A&Ox3 ABD: soft, nontender, nondistended, no recurrent hernia, does have an approx 2 x 2 area of hardening in upper scrotum  A/P 74 yo s/o open RIH repair, doing well - okay to lift > 15 lbs - no acute issues, f/u prn.

## 2014-10-06 NOTE — Patient Instructions (Signed)
No need for follow-up unless you have a change in your symptoms.  If you develop significant swelling, bruising, or pain to this right groin or testicular area, please call our office.  You can go back to heavy lifting from this day forward.  Call and ask to speak with a nurse if you have any questions or concerns.

## 2017-05-08 ENCOUNTER — Other Ambulatory Visit: Payer: Self-pay | Admitting: Internal Medicine

## 2017-05-08 DIAGNOSIS — I714 Abdominal aortic aneurysm, without rupture, unspecified: Secondary | ICD-10-CM

## 2017-05-16 ENCOUNTER — Ambulatory Visit
Admission: RE | Admit: 2017-05-16 | Discharge: 2017-05-16 | Disposition: A | Discharge status: Home / Self Care | Payer: Medicare HMO | Source: Ambulatory Visit | Attending: Internal Medicine | Admitting: Internal Medicine

## 2017-05-16 DIAGNOSIS — I714 Abdominal aortic aneurysm, without rupture, unspecified: Secondary | ICD-10-CM

## 2017-06-05 ENCOUNTER — Ambulatory Visit (INDEPENDENT_AMBULATORY_CARE_PROVIDER_SITE_OTHER): Payer: Medicare HMO | Admitting: Vascular Surgery

## 2017-06-05 ENCOUNTER — Encounter (INDEPENDENT_AMBULATORY_CARE_PROVIDER_SITE_OTHER): Payer: Self-pay | Admitting: Vascular Surgery

## 2017-06-05 VITALS — BP 163/81 | HR 56 | Resp 14 | Ht 67.0 in | Wt 173.0 lb

## 2017-06-05 DIAGNOSIS — I713 Abdominal aortic aneurysm, ruptured, unspecified: Secondary | ICD-10-CM

## 2017-06-05 DIAGNOSIS — Z8639 Personal history of other endocrine, nutritional and metabolic disease: Secondary | ICD-10-CM

## 2017-06-05 DIAGNOSIS — I714 Abdominal aortic aneurysm, without rupture, unspecified: Secondary | ICD-10-CM

## 2017-06-05 NOTE — Progress Notes (Signed)
Subjective:    Patient ID: Jason Reese, male    DOB: 10/14/1940, 77 y.o.   MRN: [id number] Chief Complaint  Patient presents with  . New Patient (Initial Visit)    Consult for AAA   Presents as a new patient referred by Dr. Arlana Pouch for evaluation of an enlarging abdominal aortic aneurysm.  The patient endorses a long-standing history of approximately 12 years of a AAA.  The patient's recent ultrasound was notable for a distal abdominal aortic aneurysm measuring 4.9 cm x 4.3 cm on the transverse images.  Small amount of mural thrombus within the aortic aneurysm particularly along the right side.  The abdominal aorta is patent.  Right common iliac artery measures up to 1.1 cm and is patent.  Left common iliac artery measures up to 1.0 cm and is patent.  IVC is patent.  When compared to the previous ultrasound on May 08, 2013 the patient's AAA measured 4.1 cm x 3.6 cm.  The patient seen with wife.  The patient presents without complaint today.  The patient denies any abdominal pain, back pain or thrombosis to the bilateral lower extremity.  The patient denies any claudication-like symptoms, rest pain or ulceration to the bilateral lower extremity.  The patient denies any fever, nausea or vomiting.  Review of Systems  Constitutional: Negative.   HENT: Negative.   Eyes: Negative.   Respiratory: Negative.   Cardiovascular:       AAA  Gastrointestinal: Negative.   Endocrine: Negative.   Genitourinary: Negative.   Musculoskeletal: Negative.   Skin: Negative.   Allergic/Immunologic: Negative.   Neurological: Negative.   Hematological: Negative.   Psychiatric/Behavioral: Negative.       Objective:   Physical Exam  Constitutional: He is oriented to person, place, and time. He appears well-developed and well-nourished. No distress.  HENT:  Head: Normocephalic and atraumatic.  Right Ear: External ear normal.  Left Ear: External ear normal.  Eyes: Pupils are equal, round, and reactive to  light. Conjunctivae and EOM are normal.  Neck: Normal range of motion.  Cardiovascular: Normal rate, regular rhythm, normal heart sounds and intact distal pulses.  Pulses:      Radial pulses are 2+ on the right side, and 2+ on the left side.       Dorsalis pedis pulses are 2+ on the right side, and 2+ on the left side.       Posterior tibial pulses are 2+ on the right side, and 2+ on the left side.  Pulmonary/Chest: Effort normal and breath sounds normal.  Abdominal: Soft. Bowel sounds are normal.  Musculoskeletal: Normal range of motion. He exhibits no edema.  Neurological: He is alert and oriented to person, place, and time.  Skin: Skin is warm and dry. He is not diaphoretic.  Psychiatric: He has a normal mood and affect. His behavior is normal. Judgment and thought content normal.  Vitals reviewed.  BP (!) 163/81 (BP Location: Right Arm, Patient Position: Sitting)   Pulse (!) 56   Resp 14   Ht  (1.702 m)   Wt 173 lb (78.5 kg)   BMI 27.10 kg/m   Past Medical History:  Diagnosis Date  . Aneurysm (HCC)   . Cataract   . Glaucoma   . Hard of hearing   . Hyperlipidemia   . PONV (postoperative nausea and vomiting)    Social History   Socioeconomic History  . Marital status: Married    Spouse name: Not on file  .  Number of children: Not on file  . Years of education: Not on file  . Highest education level: Not on file  Occupational History  . Not on file  Social Needs  . Financial resource strain: Not on file  . Food insecurity:    Worry: Not on file    Inability: Not on file  . Transportation needs:    Medical: Not on file    Non-medical: Not on file  Tobacco Use  . Smoking status: Former Smoker    Last attempt to quit: 07/28/2005    Years since quitting: 11.8  . Smokeless tobacco: Never Used  Substance and Sexual Activity  . Alcohol use: No  . Drug use: No  . Sexual activity: Not on file  Lifestyle  . Physical activity:    Days per week: Not on file     Minutes per session: Not on file  . Stress: Not on file  Relationships  . Social connections:    Talks on phone: Not on file    Gets together: Not on file    Attends religious service: Not on file    Active member of club or organization: Not on file    Attends meetings of clubs or organizations: Not on file    Relationship status: Not on file  . Intimate partner violence:    Fear of current or ex partner: Not on file    Emotionally abused: Not on file    Physically abused: Not on file    Forced sexual activity: Not on file  Other Topics Concern  . Not on file  Social History Narrative  . Not on file   Past Surgical History:  Procedure Laterality Date  . EYE SURGERY  2013  . HERNIA REPAIR Left 1980   x 2- Dr. Alan Mulder  . INGUINAL HERNIA REPAIR Right 08/25/2014   Procedure: RIGHT INGUINAL HERNIA REPAIR WITH MESH ;  Surgeon: Ida Rogue, MD;  Location: [facility] ORS;  Service: General;  Laterality: Right;  . JOINT REPLACEMENT Left 2007   shoulder  . SHOULDER ARTHROSCOPY Left    Family History  Problem Relation Age of Onset  . Diabetes Mother    No Known Allergies     Assessment & Plan:  Presents as a new patient referred by Dr. Arlana Pouch for evaluation of an enlarging abdominal aortic aneurysm.  The patient endorses a long-standing history of approximately 12 years of a AAA.  The patient's recent ultrasound was notable for a distal abdominal aortic aneurysm measuring 4.9 cm x 4.3 cm on the transverse images.  Small amount of mural thrombus within the aortic aneurysm particularly along the right side.  The abdominal aorta is patent.  Right common iliac artery measures up to 1.1 cm and is patent.  Left common iliac artery measures up to 1.0 cm and is patent.  IVC is patent.  When compared to the previous ultrasound on May 08, 2013 the patient's AAA measured 4.1 cm x 3.6 cm.  The patient seen with wife.  The patient presents without complaint today.  The patient denies any  abdominal pain, back pain or thrombosis to the bilateral lower extremity.  The patient denies any claudication-like symptoms, rest pain or ulceration to the bilateral lower extremity.  The patient denies any fever, nausea or vomiting.  1. Abdominal aortic aneurysm, ruptured [facility]) - New The patient states that he has known about his AAA for approximately 12 years. The most recent ultrasound conducted on May 16, 2017 shows growth  now measuring 4.9 cm x 4.3 cm. Since the patient's aneurysm is close to 5 cm in size I will order a CTA of the abdomen and pelvis to assess the patient's anatomy, exact size in preparation for possible repair. The patient knows to call our office after the CTA to schedule an appointment with Dr. Wyn Quaker to discuss his results The patient's blood pressure is being adequately controlled however I have reviewed the importance of hypertension and lipid control and the importance of continuing his abstinence from tobacco.  The patient is also encouraged to exercise a minimum of 30 minutes 4 times a week.  Should the patient develop new onset abdominal or back pain or signs of peripheral embolization they are instructed to seek medical attention immediately and to alert the physician providing care that they have an aneurysm.  The patient voices their understanding  - CT Angio Abd/Pel w/ and/or w/o; Future - BUN+Creat; Future  2. H/O hypercholesterolemia - Stable Encouraged good control as its slows the progression of atherosclerotic disease  Current Outpatient Medications on File Prior to Visit  Medication Sig Dispense Refill  . acetaminophen (TYLENOL) 500 MG tablet Take 500 mg by mouth every 6 (six) hours as needed.    . dorzolamide-timolol (COSOPT) 22.3-6.8 MG/ML ophthalmic solution     . latanoprost (XALATAN) 0.005 % ophthalmic solution Place 1 drop into both eyes at bedtime.  3  . Aspirin-Caffeine 845-65 MG PACK Take 1 packet by mouth 3 (three) times daily as needed (head  ache).    . AZOPT 1 % ophthalmic suspension Apply 1 drop to eye 2 (two) times daily.  2  . hydrocortisone 2.5 % cream     . lovastatin (MEVACOR) 20 MG tablet Take 1 tablet by mouth every morning.     . triamcinolone cream (KENALOG) 0.1 % APPLY 2X A DAY LIGHTLY TO HAND  1   No current facility-administered medications on file prior to visit.    There are no Patient Instructions on file for this visit. No follow-ups on file.  Aylee Littrell A Viney Acocella, PA-C

## 2017-06-21 ENCOUNTER — Ambulatory Visit
Admission: RE | Admit: 2017-06-21 | Discharge: 2017-06-21 | Disposition: A | Discharge status: Home / Self Care | Payer: Medicare HMO | Source: Ambulatory Visit | Attending: Vascular Surgery | Admitting: Vascular Surgery

## 2017-06-21 DIAGNOSIS — I7409 Other arterial embolism and thrombosis of abdominal aorta: Secondary | ICD-10-CM | POA: Diagnosis not present

## 2017-06-21 DIAGNOSIS — I714 Abdominal aortic aneurysm, without rupture: Secondary | ICD-10-CM | POA: Insufficient documentation

## 2017-06-21 DIAGNOSIS — K5730 Diverticulosis of large intestine without perforation or abscess without bleeding: Secondary | ICD-10-CM | POA: Diagnosis not present

## 2017-06-21 DIAGNOSIS — I700 Atherosclerosis of aorta: Secondary | ICD-10-CM | POA: Diagnosis not present

## 2017-06-21 DIAGNOSIS — N200 Calculus of kidney: Secondary | ICD-10-CM | POA: Diagnosis not present

## 2017-06-21 DIAGNOSIS — I713 Abdominal aortic aneurysm, ruptured, unspecified: Secondary | ICD-10-CM

## 2017-06-21 DIAGNOSIS — J439 Emphysema, unspecified: Secondary | ICD-10-CM | POA: Diagnosis not present

## 2017-06-21 LAB — POCT I-STAT CREATININE: Creatinine, Ser: 0.8 mg/dL (ref 0.61–1.24)

## 2017-06-21 MED ORDER — IOPAMIDOL (ISOVUE-370) INJECTION 76%
100.0000 mL | Freq: Once | INTRAVENOUS | Status: AC | PRN
  Administered 2017-06-21: 100 mL via INTRAVENOUS

## 2017-06-25 ENCOUNTER — Encounter (INDEPENDENT_AMBULATORY_CARE_PROVIDER_SITE_OTHER): Payer: Self-pay | Admitting: Vascular Surgery

## 2017-06-25 ENCOUNTER — Ambulatory Visit (INDEPENDENT_AMBULATORY_CARE_PROVIDER_SITE_OTHER): Payer: Medicare HMO | Admitting: Vascular Surgery

## 2017-06-25 VITALS — BP 159/72 | HR 68 | Resp 13 | Ht 70.0 in | Wt 159.0 lb

## 2017-06-25 DIAGNOSIS — I714 Abdominal aortic aneurysm, without rupture, unspecified: Secondary | ICD-10-CM

## 2017-06-25 DIAGNOSIS — Z9889 Other specified postprocedural states: Secondary | ICD-10-CM | POA: Diagnosis not present

## 2017-06-25 DIAGNOSIS — E785 Hyperlipidemia, unspecified: Secondary | ICD-10-CM

## 2017-06-25 DIAGNOSIS — R112 Nausea with vomiting, unspecified: Secondary | ICD-10-CM | POA: Diagnosis not present

## 2017-06-25 NOTE — Assessment & Plan Note (Signed)
The patient has a 5.2 cm infrarenal abdominal aortic aneurysm on the CT scan which I have independently reviewed.  This does not really involve the iliac arteries and begins about 3 cm below the renal arteries.  Recommend: The aneurysm is > 5 cm and therefore should undergo repair. Patient is status post CT scan of the abdominal aorta. The patient is a candidate for endovascular repair.   He will require cardiac clearance.   The patient will continue antiplatelet therapy as prescribed (since the patient is undergoing endovascular repair as opposed to open repair) as well as aggressive management of hyperlipidemia. Exercise is again strongly encouraged.   The patient is reminded that lifetime routine surveillance is a necessity with an endograft.   The risks and benefits of AAA repair are reviewed with the patient.  All questions are answered.  Alternative therapies are also discussed.  The patient agrees to proceed with endovascular aneurysm repair.  Patient will follow-up with me in the office after the surgery.

## 2017-06-25 NOTE — Progress Notes (Signed)
MRN : [id number]  Jason Reese is a 77 y.o. (25-Feb-1940) male who presents with chief complaint of  Chief Complaint  Patient presents with  . Follow-up    CT results  .  History of Present Illness: Patient returns today in follow up of his abdominal aortic aneurysm.  He is doing well without any new complaints from his previous visit earlier this month.  He has undergone a CT angiogram. The patient has a 5.2 cm infrarenal abdominal aortic aneurysm on the CT scan which I have independently reviewed.  This does not really involve the iliac arteries and begins about 3 cm below the renal arteries.   Current Outpatient Medications  Medication Sig Dispense Refill  . acetaminophen (TYLENOL) 500 MG tablet Take 500 mg by mouth every 6 (six) hours as needed.    . Aspirin-Caffeine 845-65 MG PACK Take 1 packet by mouth 3 (three) times daily as needed (head ache).    . AZOPT 1 % ophthalmic suspension Apply 1 drop to eye 2 (two) times daily.  2  . dorzolamide-timolol (COSOPT) 22.3-6.8 MG/ML ophthalmic solution     . hydrocortisone 2.5 % cream     . latanoprost (XALATAN) 0.005 % ophthalmic solution Place 1 drop into both eyes at bedtime.  3  . lovastatin (MEVACOR) 20 MG tablet Take 1 tablet by mouth every morning.     . triamcinolone cream (KENALOG) 0.1 % APPLY 2X A DAY LIGHTLY TO HAND  1   No current facility-administered medications for this visit.     Past Medical History:  Diagnosis Date  . Aneurysm (HCC)   . Cataract   . Glaucoma   . Hard of hearing   . Hyperlipidemia   . PONV (postoperative nausea and vomiting)     Past Surgical History:  Procedure Laterality Date  . EYE SURGERY  2013  . HERNIA REPAIR Left 1980   x 2- Dr. Alan Mulder  . INGUINAL HERNIA REPAIR Right 08/25/2014   Procedure: RIGHT INGUINAL HERNIA REPAIR WITH MESH ;  Surgeon: Ida Rogue, MD;  Location: [facility] ORS;  Service: General;  Laterality: Right;  . JOINT REPLACEMENT Left 2007   shoulder  . SHOULDER  ARTHROSCOPY Left     Social History Social History   Tobacco Use  . Smoking status: Former Smoker    Last attempt to quit: 07/28/2005    Years since quitting: 11.9  . Smokeless tobacco: Never Used  Substance Use Topics  . Alcohol use: No  . Drug use: No    Family History Family History  Problem Relation Age of Onset  . Diabetes Mother   no bleeding or clotting disorders, no aneurysms  No Known Allergies   REVIEW OF SYSTEMS (Negative unless checked)  Constitutional: Weight loss  Fever  Chills Cardiac: Chest pain   Chest pressure   Palpitations   Shortness of breath when laying flat   Shortness of breath at rest   Shortness of breath with exertion. Vascular:  Pain in legs with walking   Pain in legs at rest   Pain in legs when laying flat   Claudication   Pain in feet when walking  Pain in feet at rest  Pain in feet when laying flat   History of DVT   Phlebitis   Swelling in legs   Varicose veins   Non-healing ulcers Pulmonary:   Uses home oxygen   Productive cough   Hemoptysis   Wheeze  COPD   Asthma Neurologic:  Dizziness    Blackouts   Seizures   History of stroke   History of TIA  Aphasia   Temporary blindness   Dysphagia   Weakness or numbness in arms   Weakness or numbness in legs Musculoskeletal:  Arthritis   Joint swelling   Joint pain   Low back pain Hematologic:  Easy bruising  Easy bleeding   Hypercoagulable state   Anemic   Gastrointestinal:  Blood in stool   Vomiting blood  Gastroesophageal reflux/heartburn   Abdominal pain Genitourinary:  Chronic kidney disease   Difficult urination  Frequent urination  Burning with urination   Hematuria Skin:  Rashes   Ulcers   Wounds Psychological:  History of anxiety    History of major depression.  Physical Examination  BP (!) 159/72 (BP Location: Right Arm, Patient Position: Sitting)   Pulse 68   Resp 13    Ht  (1.778 m)   Wt 159 lb (72.1 kg)   BMI 22.81 kg/m  Gen:  WD/WN, NAD. Appears younger than stated age. Head: [place]/AT, No temporalis wasting. Ear/Nose/Throat: Hearing grossly intact, nares w/o erythema or drainage Eyes: Conjunctiva clear. Sclera non-icteric Neck: Supple.  Trachea midline Pulmonary:  Good air movement, no use of accessory muscles.  Cardiac: RRR, no JVD Vascular:  Vessel Right Left  Radial Palpable Palpable                          PT Palpable Palpable  DP Palpable Palpable   Gastrointestinal: soft, non-tender/non-distended. Increased aortic impulse Musculoskeletal: M/S 5/5 throughout.  No deformity or atrophy. No edema. Neurologic: Sensation grossly intact in extremities.  Symmetrical.  Speech is fluent.  Psychiatric: Judgment intact, Mood & affect appropriate for pt's clinical situation. Dermatologic: No rashes or ulcers noted.  No cellulitis or open wounds.       Labs Recent Results (from the past 2160 hour(s))  I-STAT creatinine     Status: None   Collection Time: 06/21/17  9:45 AM  Result Value Ref Range   Creatinine, Ser 0.80 0.61 - 1.24 mg/dL    Radiology Ct Angio Abd/pel W/ And/or W/o  Result Date: 06/21/2017 CLINICAL DATA:  Evaluate size of abdominal aortic aneurysm for operative planning. EXAM: CTA ABDOMEN AND PELVIS WITH CONTRAST TECHNIQUE: Multidetector CT imaging of the abdomen and pelvis was performed using the standard protocol during bolus administration of intravenous contrast. Multiplanar reconstructed images and MIPs were obtained and reviewed to evaluate the vascular anatomy. CONTRAST:  ISOVUE-370 IOPAMIDOL (ISOVUE-370) INJECTION 76% COMPARISON:  Aortic ultrasound-05/16/2017; CT abdomen and pelvis-04/02/2012 FINDINGS: VASCULAR Aorta: Moderate amount of mixed calcified and noncalcified atherosclerotic plaque throughout the abdominal aorta, not resulting in a hemodynamically significant stenosis. Infrarenal abdominal aortic  aneurysm measuring approximately 4.7 x 5.2 x 4.6 cm as measured in greatest oblique short axis axial (image 85, series 5), coronal (image 65, series 8) and sagittal (image 90, series 10) dimensions respectively. The aneurysm originates approximately 3.4 cm caudal to the take-off of the most inferior left renal artery and extends to the level of the aortic bifurcation without extension to involve either of the common iliac arteries. There is a minimal amount of crescentic noncalcified thrombus within the dominant component of the aneurysm. No definitive periaortic stranding. Celiac: There is a minimal amount of calcified atherosclerotic plaque involving the origin of the celiac artery, not resulting in a hemodynamically significant stenosis. The distal aspect of the main trunk of the celiac artery appears mildly ectatic measuring 1.2 cm in  diameter (image 41, series 5). Conventional branching pattern. SMA: There is a minimal amount of mixed calcified and noncalcified atherosclerotic plaque involving the origin the SMA, not resulting in hemodynamically significant stenosis. Conventional branching pattern. The distal tributaries of the SMA appear widely patent without discrete intraluminal filling defect to suggest distal embolism. Renals: There is a minimal amount of mixed calcified and noncalcified atherosclerotic plaque involving the origin of the bilateral renal arteries, not resulting in a hemodynamically significant stenosis. There is minimal beaded irregularity involving the distal aspect of the left renal artery (coronal image 83, series 8), as could be seen in the setting mild FMD. IMA: Remains patent. Inflow: There is mixed calcified and noncalcified atherosclerotic plaque within the bilateral normal caliber common iliac arteries, not resulting in hemodynamically significant stenosis. The bilateral internal iliac arteries are diseased though patent and of normal caliber. The bilateral external iliac arteries  are of normal caliber and widely patent without hemodynamically significant stenosis. Proximal Outflow: There is a minimal amount of predominantly calcified atherosclerotic plaque within the bilateral common femoral arteries, not definitely resulting in hemodynamically significant stenosis. The imaged portions of the bilateral superficial and deep femoral arteries appear widely patent throughout their imaged course. Veins: The IVC and pelvic venous system appear widely patent. Review of the MIP images confirms the above findings. NON-VASCULAR Lower chest: Limited visualization of lower thorax demonstrates moderate severe centrilobular emphysematous change. Minimal subsegmental atelectasis within the imaged caudal aspect the right middle lobe and lingula. No focal airspace opacities. No pleural effusion. Borderline cardiomegaly.  No pericardial effusion. Hepatobiliary: Normal hepatic contour. Note is made of an approximately 1.7 cm hypoattenuating cyst within the right lobe of the liver (image 25, series 6). Additional subcentimeter hypoattenuating hepatic lesions are too small to adequately characterize of favored to represent additional hepatic cysts. Normal appearance of the gallbladder given degree distention. No radiopaque gallstones. No intra extrahepatic biliary duct dilatation. No ascites. Pancreas: Normal appearance of the pancreas Spleen: Normal appearance of the spleen Adrenals/Urinary Tract: There is symmetric enhancement of the bilateral kidneys. Bilateral nonobstructing nephrolithiasis with index nonobstructing right-sided renal stone measuring 0.8 cm in greatest short axis diameter (coronal image 98, series 8 and index left-sided nonobstructing stone measuring 1.3 cm (image 96, series 8). Note is made of nonenhancing right-sided renal cysts with index cyst arising from the anterior superior pole the right kidney measuring approximately 3.3 cm in diameter. Additional subcentimeter bilateral  hypoattenuating renal lesions too small to adequately characterize of favored to represent additional renal cysts. No urine obstruction or perinephric stranding. Normal appearance the bilateral adrenal glands. Normal appearance of the urinary bladder given degree of distention. Stomach/Bowel: Large hiatal hernia. Moderate to large colonic stool burden without evidence of enteric obstruction. Colonic diverticulosis, most severely affecting the sigmoid colon within the left lower abdomen/pelvis without superimposed acute diverticulitis. Normal appearance of the terminal ileum and retrocecal appendix. No pneumoperitoneum, pneumatosis or portal venous gas. Lymphatic: No bulky retroperitoneal, mesenteric, pelvic or inguinal lymphadenopathy. Reproductive: Dystrophic calcifications within normal sized prostate gland. No free fluid in the pelvic cul-de-sac. Other: Post left-sided inguinal hernia repair without evidence of recurrence. Regional soft tissues appear otherwise normal. Musculoskeletal: No acute or aggressive osseous abnormalities. 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 IMPRESSION: VASCULAR 1. Approximately 5.2 cm infrarenal abdominal aortic aneurysm with minimal amount of associated crescentic mural thrombus. Aortic aneurysm NOS (ICD10-I71.9). No dissection or periaortic stranding. 2.  Aortic Atherosclerosis (ICD10-I70.0). 3. Very mild beaded  irregularity involving the distal aspect of the left renal artery as could be seen in the setting of mild FMD. NON-VASCULAR 1. Bilateral nonobstructing nephrolithiasis. 2.  Emphysema (ICD10-J43.9). 3. Rather extensive colonic diverticulosis without evidence of superimposed acute diverticulitis. Electronically Signed   By: Simonne Come M.D.   On: 06/21/2017 10:37    Assessment/Plan  Hyperlipidemia lipid control important in reducing the progression of atherosclerotic disease. Continue statin therapy   PONV  (postoperative nausea and vomiting) Recommended he discuss this with anesthesia prior to his surgery so that this can be best managed.  Aortic aneurysm The patient has a 5.2 cm infrarenal abdominal aortic aneurysm on the CT scan which I have independently reviewed.  This does not really involve the iliac arteries and begins about 3 cm below the renal arteries.  Recommend: The aneurysm is > 5 cm and therefore should undergo repair. Patient is status post CT scan of the abdominal aorta. The patient is a candidate for endovascular repair.   He will require cardiac clearance.   The patient will continue antiplatelet therapy as prescribed (since the patient is undergoing endovascular repair as opposed to open repair) as well as aggressive management of hyperlipidemia. Exercise is again strongly encouraged.   The patient is reminded that lifetime routine surveillance is a necessity with an endograft.   The risks and benefits of AAA repair are reviewed with the patient.  All questions are answered.  Alternative therapies are also discussed.  The patient agrees to proceed with endovascular aneurysm repair.  Patient will follow-up with me in the office after the surgery.    Festus Barren, MD  06/25/2017 2:03 PM    This note was created with Dragon medical transcription system.  Any errors from dictation are purely unintentional

## 2017-06-25 NOTE — Assessment & Plan Note (Signed)
lipid control important in reducing the progression of atherosclerotic disease. Continue statin therapy  

## 2017-06-25 NOTE — Patient Instructions (Signed)
Abdominal Aortic Aneurysm Endograft Repair Abdominal aortic aneurysm endograft repair is a surgery to fix an aortic aneurysm in the abdominal area. An aneurysm is a weak or damaged part of an artery wall that bulges out from the normal force of blood pumping through the body. An abdominal aortic aneurysm is an aneurysm that happens in the lower part of the aorta, which is the main artery of the body. The repair is often done if the aneurysm gets so large that it might burst (rupture). A ruptured aneurysm would cause bleeding inside the body that could put a person's life in danger. Before that happens, this procedure is needed to fix the problem. The procedure may also be done if the aneurysm causes symptoms such as pain in the back, abdomen, or side. In this procedure, a tube made of fabric and metal mesh (endograft or stent-graft) is placed in the weak part of the aorta to repair it. Tell a health care provider about:  Any allergies you have.  All medicines you are taking, including vitamins, herbs, eye drops, creams, and over-the-counter medicines.  Any problems you or family members have had with anesthetic medicines.  Any blood disorders you have.  Any surgeries you have had.  Any medical conditions you have.  Whether you are pregnant or may be pregnant. What are the risks? Generally, this is a safe procedure. However, problems may occur, including:  Infection of the graft or incision area.  Bleeding during the procedure or from the incision site.  Allergic reactions to medicines.  Damage to other structures or organs.  Blood leaking out around the endograft.  The endograft moving from where it was placed during surgery.  Blood flow through the graft becoming blocked.  Blood clots.  Kidney problems.  Blood flow to the legs becoming blocked (rare).  Rupture of the aorta even after the endograft repair is a success (rare).  What happens before the procedure? Staying  hydrated Follow instructions from your health care provider about hydration, which may include:  Up to 2 hours before the procedure - you may continue to drink clear liquids, such as water, clear fruit juice, black coffee, and plain tea.  Eating and drinking restrictions Follow instructions from your health care provider about eating and drinking, which may include:  8 hours before the procedure - stop eating heavy meals or foods such as meat, fried foods, or fatty foods.  6 hours before the procedure - stop eating light meals or foods, such as toast or cereal.  6 hours before the procedure - stop drinking milk or drinks that contain milk.  2 hours before the procedure - stop drinking clear liquids.  Medicines  Ask your health care provider about: ? Changing or stopping your regular medicines. This is especially important if you are taking diabetes medicines or blood thinners. ? Taking medicines such as aspirin and ibuprofen. These medicines can thin your blood. Do not take these medicines before your procedure if your health care provider instructs you not to.  You may be given antibiotic medicine to help prevent infection. General instructions  You may need to have blood tests, a test to check heart rhythm (electrocardiogram, or ECG), or a test to check blood flow (angiogram) before the surgery.  Imaging tests will be done to check the size and location of the aneurysm. These tests could include an ultrasound, a CT scan, or an MRI.  Do not use any products that contain nicotine or tobacco-such as cigarettes and   e-cigarettes-for as long as possible before the surgery. If you need help quitting, ask your health care provider.  Ask your health care provider how your surgical site will be marked or identified.  Plan to have someone take you home from the hospital or clinic. What happens during the procedure?  To reduce your risk of infection: ? Your health care team will wash or  sanitize their hands. ? Your skin will be washed with soap. ? Hair may be removed from the surgical area.  An IV tube will be inserted into one of your veins.  You will be given one or more of the following: ? A medicine to help you relax (sedative). ? A medicine to numb the area (local anesthetic). ? A medicine to make you fall asleep (general anesthetic). ? A medicine that is injected into an area of your body to numb everything below the injection site (regional anesthetic).  During the surgery: ? Small incisions or a puncture will be made on one or both sides of the groin. Long, thin tubes (catheters) will be passed through the opening, put into the artery in your thigh, and moved up into the aneurysm in the aorta. ? The health care provider will use live X-ray pictures to guide the endograft through the catheterto the place where the aneurysm is. ? The endograft will be released to seal off the aneurysm and to line the aorta. It will keep blood from flowing into the aneurysm and will help keep it from rupturing. The endograft will stay in place and will not be taken out. ? X-rays will be used to check where the endograft is placed and to make sure that it is where it should be. ? The catheter will be taken out, and the incision will be closed with stitches (sutures). The procedure may vary among health care providers and hospitals. What happens after the procedure?  Your blood pressure, heart rate, breathing rate, and blood oxygen level will be monitored until the medicines you were given have worn off.  You will need to lie flat for a number of hours. Bending your legs can cause them to bleed and swell.  You will then be urged to get up and move around a number of times each day and to slowly become more active.  You will be given medicines to control pain.  Certain tests may be done after your procedure to check how well the endograft is working and to check its placement.  Do  not drive for 24 hours if you received a sedative. This information is not intended to replace advice given to you by your health care provider. Make sure you discuss any questions you have with your health care provider. Document Released: 06/03/2008 Document Revised: 08/05/2015 Document Reviewed: 04/11/2015 Elsevier Interactive Patient Education  2018 [employer]  

## 2017-06-25 NOTE — Assessment & Plan Note (Signed)
Recommended he discuss this with anesthesia prior to his surgery so that this can be best managed.

## 2017-07-02 ENCOUNTER — Ambulatory Visit (INDEPENDENT_AMBULATORY_CARE_PROVIDER_SITE_OTHER): Payer: Medicare HMO | Admitting: Vascular Surgery

## 2017-07-03 ENCOUNTER — Ambulatory Visit: Payer: Medicare HMO | Admitting: Cardiovascular Disease

## 2017-07-03 ENCOUNTER — Encounter: Payer: Self-pay | Admitting: Cardiovascular Disease

## 2017-07-03 VITALS — BP 140/82 | HR 51 | Ht 67.0 in | Wt 171.0 lb

## 2017-07-03 DIAGNOSIS — J432 Centrilobular emphysema: Secondary | ICD-10-CM

## 2017-07-03 DIAGNOSIS — I714 Abdominal aortic aneurysm, without rupture, unspecified: Secondary | ICD-10-CM | POA: Insufficient documentation

## 2017-07-03 DIAGNOSIS — E782 Mixed hyperlipidemia: Secondary | ICD-10-CM | POA: Diagnosis not present

## 2017-07-03 DIAGNOSIS — F17200 Nicotine dependence, unspecified, uncomplicated: Secondary | ICD-10-CM | POA: Diagnosis not present

## 2017-07-03 DIAGNOSIS — J439 Emphysema, unspecified: Secondary | ICD-10-CM | POA: Insufficient documentation

## 2017-07-03 NOTE — Patient Instructions (Signed)

## 2017-07-03 NOTE — Progress Notes (Signed)
Cardiology Office Note  Date:  07/03/2017   ID:  Jason Reese, DOB 1940-02-08, MRN [id number]  PCP:  Jason Shaggy, MD   Chief Complaint  Patient presents with  . other    Ref by Dr. Wyn Reese for cardiac clearance for AAA surgery. Meds reviewed by the pt. verbally. Pt. c/o LE edema by evening time.      HPI:  [name] is a 77 yo gentleman with past medical history of Smoker 40 years HTN abdominal aortic aneurysm.  5.2 cm on recent CT scan Who presents by referral from Dr. Wyn Reese for preoperative evaluation before aortic aneurysm surgery  He reports he feels well overall with no complaints Good exercise tolerance, does the push mowing without any symptoms of shortness of breath or chest pain,  Does a lot of housework, dusting vacuuming Wife limited secondary to eye problems, scheduled for cataract surgery tomorrow  CT scan images pulled up in the office and reviewed with him in detail 5.2 cm infrarenal abdominal aortic aneurysm , mural thrombus Minimal descending aorta plaque/atherosclerosis does not really involve the iliac arteries and begins about 3 cm below the renal arteries.   records requested from primary care, Dr. Arlana Reese These were reviewed in the office today showing total cholesterol 148 LDL 85 normal LFTs creatinine 0.71  Blood pressures reviewed,was elevated with Dr. Arlana Reese but improved on recheck down to 134 systolic  EKG personally reviewed by myself on todays visit Shows normal sinus rhythm with rate 51 bpm nonspecific T wave abnormality III,  aVF   PMH:   has a past medical history of Aneurysm (HCC), Cataract, Glaucoma, Hard of hearing, Hyperlipidemia, and PONV (postoperative nausea and vomiting).  PSH:    Past Surgical History:  Procedure Laterality Date  . EYE SURGERY  2013  . HERNIA REPAIR Left 1980   x 2- Dr. Alan Reese  . INGUINAL HERNIA REPAIR Right 08/25/2014   Procedure: RIGHT INGUINAL HERNIA REPAIR WITH MESH ;  Surgeon: Jason Rogue, MD;   Location: [facility] ORS;  Service: General;  Laterality: Right;  . JOINT REPLACEMENT Left 2007   shoulder  . SHOULDER ARTHROSCOPY Left     Current Outpatient Medications  Medication Sig Dispense Refill  . acetaminophen (TYLENOL) 500 MG tablet Take 500 mg by mouth every 6 (six) hours as needed.     . Aspirin-Caffeine 845-65 MG PACK Take 1 packet by mouth 3 (three) times daily as needed (head ache).    . dorzolamide-timolol (COSOPT) 22.3-6.8 MG/ML ophthalmic solution Place 1 drop into both eyes 2 (two) times daily.     Marland Kitchen latanoprost (XALATAN) 0.005 % ophthalmic solution Place 1 drop into both eyes at bedtime.  3  . lovastatin (MEVACOR) 20 MG tablet Take 1 tablet by mouth every morning.     . triamcinolone cream (KENALOG) 0.1 % Apply 1 application topically 2 (two) times daily as needed (apply to hands as needed for irritation).     No current facility-administered medications for this visit.      Allergies:   Patient has no known allergies.   Social History:  The patient  reports that he quit smoking about 11 years ago. He has never used smokeless tobacco. He reports that he does not drink alcohol or use drugs.   Family History:   family history includes Diabetes in his mother.    Review of Systems: Review of Systems  Constitutional: Negative.   Respiratory: Negative.   Cardiovascular: Negative.   Gastrointestinal: Negative.   Musculoskeletal:  Negative.   Neurological: Negative.   Psychiatric/Behavioral: Negative.   All other systems reviewed and are negative.    PHYSICAL EXAM: VS:  BP 140/82 (BP Location: Right Arm, Patient Position: Sitting, Cuff Size: Normal)   Pulse (!) 51   Ht 5\' 7"  (1.702 m)   Wt 171 lb (77.6 kg)   BMI 26.78 kg/m  , BMI Body mass index is 26.78 kg/m. GEN: Well nourished, well developed, in no acute distress  HEENT: normal  Neck: no JVD, carotid bruits, or masses Cardiac: RRR; no murmurs, rubs, or gallops,no edema  Respiratory:  clear to auscultation  bilaterally, normal work of breathing GI: soft, nontender, nondistended, + BS MS: no deformity or atrophy  Skin: warm and dry, no rash Neuro:  Strength and sensation are intact Psych: euthymic mood, full affect    Recent Labs: 06/21/2017: Creatinine, Ser 0.80    Lipid Panel No results found for: CHOL, HDL, LDLCALC, TRIG    Wt Readings from Last 3 Encounters:  07/03/17 171 lb (77.6 kg)  06/25/17 159 lb (72.1 kg)  06/05/17 173 lb (78.5 kg)       ASSESSMENT AND PLAN:  AAA (abdominal aortic aneurysm) without rupture (HCC) - Plan: EKG 12-Lead 5.2 cm infrarenal, CT scan reviewed with him in detail Acceptable risk for procedure. No further testing indicated at this time as he does not have anginal symptoms, good exercise tolerance Candidate for endovascular repair No medication changes made  Mixed hyperlipidemia - Plan: EKG 12-Lead Cholesterol is at goal on the current lipid regimen. No changes to the medications were made.  Smoker - Plan: EKG 12-Lead Quit smoking 12 years ago  Emphysema Seen on CT scan Stable, denies significant shortness of breath on exertion  Disposition:   F/U  As needed   Total encounter time more than 45 minutes  Greater than 50% was spent in counseling and coordination of care with the patient    Orders Placed This Encounter  Procedures  . EKG 12-Lead     Signed, Jason Reese, M.D., Ph.D. 07/03/2017  [facility], [place] [phone]

## 2017-07-12 ENCOUNTER — Encounter (INDEPENDENT_AMBULATORY_CARE_PROVIDER_SITE_OTHER): Payer: Self-pay

## 2017-07-15 ENCOUNTER — Other Ambulatory Visit (INDEPENDENT_AMBULATORY_CARE_PROVIDER_SITE_OTHER): Payer: Self-pay | Admitting: Vascular Surgery

## 2017-07-18 ENCOUNTER — Encounter
Admission: RE | Admit: 2017-07-18 | Discharge: 2017-07-18 | Disposition: A | Discharge status: Home / Self Care | Payer: Medicare HMO | Source: Ambulatory Visit | Attending: Vascular Surgery | Admitting: Vascular Surgery

## 2017-07-18 DIAGNOSIS — Z01812 Encounter for preprocedural laboratory examination: Secondary | ICD-10-CM | POA: Insufficient documentation

## 2017-07-18 DIAGNOSIS — I714 Abdominal aortic aneurysm, without rupture: Secondary | ICD-10-CM | POA: Insufficient documentation

## 2017-07-18 HISTORY — DX: Personal history of urinary calculi: Z87.442

## 2017-07-18 LAB — CBC WITH DIFFERENTIAL/PLATELET
Basophils Absolute: 0.1 10*3/uL (ref 0–0.1)
Basophils Relative: 1 %
EOS ABS: 0.3 10*3/uL (ref 0–0.7)
Eosinophils Relative: 4 %
HCT: 41.1 % (ref 40.0–52.0)
HEMOGLOBIN: 13.8 g/dL (ref 13.0–18.0)
LYMPHS ABS: 1 10*3/uL (ref 1.0–3.6)
Lymphocytes Relative: 14 %
MCH: 27 pg (ref 26.0–34.0)
MCHC: 33.5 g/dL (ref 32.0–36.0)
MCV: 80.5 fL (ref 80.0–100.0)
MONOS PCT: 6 %
Monocytes Absolute: 0.5 10*3/uL (ref 0.2–1.0)
NEUTROS PCT: 75 %
Neutro Abs: 5.3 10*3/uL (ref 1.4–6.5)
Platelets: 145 10*3/uL — ABNORMAL LOW (ref 150–440)
RBC: 5.11 MIL/uL (ref 4.40–5.90)
RDW: 15.3 % — ABNORMAL HIGH (ref 11.5–14.5)
WBC: 7.2 10*3/uL (ref 3.8–10.6)

## 2017-07-18 LAB — BASIC METABOLIC PANEL
Anion gap: 6 (ref 5–15)
BUN: 13 mg/dL (ref 6–20)
CHLORIDE: 110 mmol/L (ref 101–111)
CO2: 24 mmol/L (ref 22–32)
CREATININE: 0.67 mg/dL (ref 0.61–1.24)
Calcium: 8.5 mg/dL — ABNORMAL LOW (ref 8.9–10.3)
GFR calc non Af Amer: >60 mL/min (ref >60)
Glucose, Bld: 100 mg/dL — ABNORMAL HIGH (ref 65–99)
Potassium: 3.5 mmol/L (ref 3.5–5.1)
Sodium: 140 mmol/L (ref 135–145)

## 2017-07-18 LAB — APTT: APTT: 32 s (ref 24–36)

## 2017-07-18 LAB — PROTIME-INR
INR: 0.93
PROTHROMBIN TIME: 12.4 s (ref 11.4–15.2)

## 2017-07-18 LAB — TYPE AND SCREEN
ABO/RH(D): A POS
Antibody Screen: NEGATIVE

## 2017-07-18 LAB — SURGICAL PCR SCREEN
MRSA, PCR: NEGATIVE
STAPHYLOCOCCUS AUREUS: NEGATIVE

## 2017-07-18 NOTE — Patient Instructions (Signed)
Your procedure is scheduled on:Wed 07/24/17 Report to Special Procedures. Nurse will call you with time   Scheduled at 7:30 right now  Remember: Instructions that are not followed completely may result in serious medical risk, up to and including death, or upon the discretion of your surgeon and anesthesiologist your surgery may need to be rescheduled.     _X__ 1. Do not eat food after midnight the night before your procedure.                 No gum chewing or hard candies. You may drink clear liquids up to 2 hours                 before you are scheduled to arrive for your surgery- DO not drink clear                 liquids within 2 hours of the start of your surgery.                 Clear Liquids include:  water, apple juice without pulp, clear carbohydrate                 drink such as Clearfast of Gartorade, Black Coffee or Tea (Do not add                 anything to coffee or tea).  __X__2.  On the morning of surgery brush your teeth with toothpaste and water, you may rinse your mouth with mouthwash if you wish.  Do not swallow any toothpaste of mouthwash.     _X__ 3.  No Alcohol for 24 hours before or after surgery.   _X__ 4.  Do Not Smoke or use e-cigarettes For 24 Hours Prior to Your Surgery.                 Do not use any chewable tobacco products for at least 6 hours prior to                 surgery.  ____  5.  Bring all medications with you on the day of surgery if instructed.   ____  6.  Notify your doctor if there is any change in your medical condition      (cold, fever, infections).     Do not wear jewelry, make-up, hairpins, clips or nail polish. Do not wear lotions, powders, or perfumes. You may wear deodorant. Do not shave 48 hours prior to surgery. Men may shave face and neck. Do not bring valuables to the hospital.    [facility] is not responsible for any belongings or valuables.  Contacts, dentures or bridgework may not be worn into  surgery. Leave your suitcase in the car. After surgery it may be brought to your room. For patients admitted to the hospital, discharge time is determined by your treatment team.   Patients discharged the day of surgery will not be allowed to drive home.   Please read over the following fact sheets that you were given:    x____ Take these medicines the morning of surgery with A SIP OF WATER:    1aspirin EC 81 MG tablet  2. dorzolamide-timolol (COSOPT) 22.3-6.8 MG/ML ophthalmic solution  3. latanoprost (XALATAN) 0.005 % ophthalmic solution  4.lovastatin (MEVACOR) 20 MG tablet  5.  6.  ____ Fleet Enema (as directed)   __x__ Use CHG Soap as directed  ____ Use inhalers on the day of surgery  ____ Stop metformin  2 days prior to surgery    ____ Take 1/2 of usual insulin dose the night before surgery. No insulin the morning          of surgery.   __x__ continue aspirin   ____ Stop Anti-inflammatories on    ____ Stop supplements until after surgery.    ____ Bring C-Pap to the hospital.

## 2017-07-24 ENCOUNTER — Inpatient Hospital Stay: Payer: Medicare HMO | Admitting: Certified Registered Nurse Anesthetist

## 2017-07-24 ENCOUNTER — Inpatient Hospital Stay
Admission: RE | Admit: 2017-07-24 | Discharge: 2017-07-25 | DRG: 269 | Disposition: A | Discharge status: Home / Self Care | Payer: Medicare HMO | Source: Ambulatory Visit | Attending: Vascular Surgery | Admitting: Vascular Surgery

## 2017-07-24 ENCOUNTER — Encounter: Payer: Self-pay | Admitting: *Deleted

## 2017-07-24 ENCOUNTER — Encounter
Admission: RE | Disposition: A | Discharge status: Home / Self Care | Payer: Self-pay | Source: Ambulatory Visit | Attending: Vascular Surgery

## 2017-07-24 DIAGNOSIS — I714 Abdominal aortic aneurysm, without rupture, unspecified: Secondary | ICD-10-CM | POA: Diagnosis present

## 2017-07-24 DIAGNOSIS — E785 Hyperlipidemia, unspecified: Secondary | ICD-10-CM | POA: Diagnosis present

## 2017-07-24 DIAGNOSIS — H409 Unspecified glaucoma: Secondary | ICD-10-CM | POA: Diagnosis present

## 2017-07-24 DIAGNOSIS — Z87442 Personal history of urinary calculi: Secondary | ICD-10-CM | POA: Diagnosis not present

## 2017-07-24 DIAGNOSIS — H9190 Unspecified hearing loss, unspecified ear: Secondary | ICD-10-CM | POA: Diagnosis present

## 2017-07-24 DIAGNOSIS — Z87891 Personal history of nicotine dependence: Secondary | ICD-10-CM

## 2017-07-24 HISTORY — PX: ENDOVASCULAR STENT GRAFT (AAA): CATH118280

## 2017-07-24 LAB — GLUCOSE, CAPILLARY: Glucose-Capillary: 125 mg/dL — ABNORMAL HIGH (ref 70–99)

## 2017-07-24 LAB — ABO/RH: ABO/RH(D): A POS

## 2017-07-24 LAB — MRSA PCR SCREENING: MRSA by PCR: NEGATIVE

## 2017-07-24 SURGERY — ENDOVASCULAR REPAIR/STENT GRAFT
Anesthesia: General

## 2017-07-24 MED ORDER — ONDANSETRON HCL 4 MG/2ML IJ SOLN: 4.0000 mg | Freq: Four times a day (QID) | INTRAMUSCULAR | Status: DC | PRN

## 2017-07-24 MED ORDER — FENTANYL CITRATE (PF) 100 MCG/2ML IJ SOLN
INTRAMUSCULAR | Status: DC | PRN
  Administered 2017-07-24 (×2): 25 ug via INTRAVENOUS
  Administered 2017-07-24: 50 ug via INTRAVENOUS

## 2017-07-24 MED ORDER — EPHEDRINE SULFATE 50 MG/ML IJ SOLN
INTRAMUSCULAR | Status: AC
  Filled 2017-07-24: qty 1

## 2017-07-24 MED ORDER — FENTANYL CITRATE (PF) 100 MCG/2ML IJ SOLN
INTRAMUSCULAR | Status: AC
  Filled 2017-07-24: qty 2

## 2017-07-24 MED ORDER — ROCURONIUM BROMIDE 50 MG/5ML IV SOLN
INTRAVENOUS | Status: AC
  Filled 2017-07-24: qty 1

## 2017-07-24 MED ORDER — ONDANSETRON HCL 4 MG/2ML IJ SOLN
4.0000 mg | Freq: Once | INTRAMUSCULAR | Status: AC | PRN
  Administered 2017-07-24: 4 mg via INTRAVENOUS

## 2017-07-24 MED ORDER — IOPAMIDOL (ISOVUE-300) INJECTION 61%
INTRAVENOUS | Status: DC | PRN
  Administered 2017-07-24: 50 mL via INTRA_ARTERIAL

## 2017-07-24 MED ORDER — DEXAMETHASONE SODIUM PHOSPHATE 10 MG/ML IJ SOLN
INTRAMUSCULAR | Status: AC
  Filled 2017-07-24: qty 1

## 2017-07-24 MED ORDER — MORPHINE SULFATE (PF) 2 MG/ML IV SOLN: 2.0000 mg | INTRAVENOUS | Status: DC | PRN

## 2017-07-24 MED ORDER — HYDRALAZINE HCL 20 MG/ML IJ SOLN
5.0000 mg | INTRAMUSCULAR | Status: AC | PRN
  Administered 2017-07-24 (×2): 5 mg via INTRAVENOUS

## 2017-07-24 MED ORDER — ASPIRIN EC 81 MG PO TBEC
81.0000 mg | DELAYED_RELEASE_TABLET | Freq: Every day | ORAL | Status: DC
  Administered 2017-07-25: 81 mg via ORAL
  Filled 2017-07-24 (×2): qty 1

## 2017-07-24 MED ORDER — ONDANSETRON HCL 4 MG/2ML IJ SOLN
INTRAMUSCULAR | Status: DC | PRN
  Administered 2017-07-24: 4 mg via INTRAVENOUS

## 2017-07-24 MED ORDER — FAMOTIDINE IN NACL 20-0.9 MG/50ML-% IV SOLN
20.0000 mg | Freq: Two times a day (BID) | INTRAVENOUS | Status: DC
  Administered 2017-07-24: 20 mg via INTRAVENOUS
  Filled 2017-07-24: qty 50

## 2017-07-24 MED ORDER — ROCURONIUM BROMIDE 100 MG/10ML IV SOLN
INTRAVENOUS | Status: DC | PRN
  Administered 2017-07-24 (×2): 50 mg via INTRAVENOUS

## 2017-07-24 MED ORDER — EPHEDRINE SULFATE 50 MG/ML IJ SOLN
INTRAMUSCULAR | Status: DC | PRN
  Administered 2017-07-24: 10 mg via INTRAVENOUS
  Administered 2017-07-24: 5 mg via INTRAVENOUS

## 2017-07-24 MED ORDER — PHENYLEPHRINE HCL 10 MG/ML IJ SOLN
INTRAMUSCULAR | Status: DC | PRN
  Administered 2017-07-24 (×2): 100 ug via INTRAVENOUS

## 2017-07-24 MED ORDER — FAMOTIDINE 20 MG PO TABS: 20.0000 mg | ORAL_TABLET | Freq: Once | ORAL | Status: DC

## 2017-07-24 MED ORDER — PROPOFOL 10 MG/ML IV BOLUS
INTRAVENOUS | Status: DC | PRN
  Administered 2017-07-24: 120 mg via INTRAVENOUS

## 2017-07-24 MED ORDER — LABETALOL HCL 5 MG/ML IV SOLN: 10.0000 mg | INTRAVENOUS | Status: DC | PRN

## 2017-07-24 MED ORDER — CHLORHEXIDINE GLUCONATE CLOTH 2 % EX PADS: 6.0000 | MEDICATED_PAD | Freq: Once | CUTANEOUS | Status: DC

## 2017-07-24 MED ORDER — PROMETHAZINE HCL 25 MG/ML IJ SOLN
INTRAMUSCULAR | Status: AC
  Filled 2017-07-24: qty 1

## 2017-07-24 MED ORDER — ACETAMINOPHEN 500 MG PO TABS: 500.0000 mg | ORAL_TABLET | Freq: Four times a day (QID) | ORAL | Status: DC | PRN

## 2017-07-24 MED ORDER — PROMETHAZINE HCL 25 MG/ML IJ SOLN
6.2500 mg | Freq: Once | INTRAMUSCULAR | Status: AC
  Administered 2017-07-24: 6.25 mg via INTRAVENOUS

## 2017-07-24 MED ORDER — SODIUM CHLORIDE 0.9 % IV SOLN: 500.0000 mL | Freq: Once | INTRAVENOUS | Status: DC | PRN

## 2017-07-24 MED ORDER — OXYCODONE-ACETAMINOPHEN 5-325 MG PO TABS: 1.0000 | ORAL_TABLET | ORAL | Status: DC | PRN

## 2017-07-24 MED ORDER — ALUM & MAG HYDROXIDE-SIMETH 200-200-20 MG/5ML PO SUSP
15.0000 mL | ORAL | Status: DC | PRN
  Filled 2017-07-24: qty 30

## 2017-07-24 MED ORDER — LATANOPROST 0.005 % OP SOLN
1.0000 [drp] | Freq: Every day | OPHTHALMIC | Status: DC
  Administered 2017-07-24: 1 [drp] via OPHTHALMIC
  Filled 2017-07-24: qty 2.5

## 2017-07-24 MED ORDER — DORZOLAMIDE HCL-TIMOLOL MAL 2-0.5 % OP SOLN
1.0000 [drp] | Freq: Two times a day (BID) | OPHTHALMIC | Status: DC
  Administered 2017-07-24 – 2017-07-25 (×2): 1 [drp] via OPHTHALMIC
  Filled 2017-07-24: qty 10

## 2017-07-24 MED ORDER — LACTATED RINGERS IV SOLN
INTRAVENOUS | Status: DC
  Administered 2017-07-24: 08:00:00 via INTRAVENOUS

## 2017-07-24 MED ORDER — ONDANSETRON HCL 4 MG/2ML IJ SOLN: 4.0000 mg | Freq: Once | INTRAMUSCULAR | Status: DC

## 2017-07-24 MED ORDER — IOPAMIDOL (ISOVUE-300) INJECTION 61%
INTRAVENOUS | Status: DC | PRN
  Administered 2017-07-24: 50 mL via INTRAVENOUS

## 2017-07-24 MED ORDER — ONDANSETRON HCL 4 MG/2ML IJ SOLN
INTRAMUSCULAR | Status: AC
  Filled 2017-07-24: qty 2

## 2017-07-24 MED ORDER — GLYCOPYRROLATE 0.2 MG/ML IJ SOLN
INTRAMUSCULAR | Status: AC
  Filled 2017-07-24: qty 1

## 2017-07-24 MED ORDER — SODIUM CHLORIDE 0.9 % IV SOLN
INTRAVENOUS | Status: DC
  Administered 2017-07-24: 16:00:00 via INTRAVENOUS

## 2017-07-24 MED ORDER — MIDAZOLAM HCL 2 MG/2ML IJ SOLN
2.0000 mg | Freq: Once | INTRAMUSCULAR | Status: AC
  Administered 2017-07-24: 2 mg via INTRAVENOUS

## 2017-07-24 MED ORDER — MIDAZOLAM HCL 2 MG/2ML IJ SOLN
INTRAMUSCULAR | Status: AC
  Administered 2017-07-24: 2 mg via INTRAVENOUS
  Filled 2017-07-24: qty 2

## 2017-07-24 MED ORDER — GUAIFENESIN-DM 100-10 MG/5ML PO SYRP
15.0000 mL | ORAL_SOLUTION | ORAL | Status: DC | PRN
  Filled 2017-07-24: qty 15

## 2017-07-24 MED ORDER — DOPAMINE-DEXTROSE 3.2-5 MG/ML-% IV SOLN: 3.0000 ug/kg/min | INTRAVENOUS | Status: DC

## 2017-07-24 MED ORDER — CEFAZOLIN SODIUM-DEXTROSE 2-4 GM/100ML-% IV SOLN
2.0000 g | Freq: Three times a day (TID) | INTRAVENOUS | Status: AC
  Administered 2017-07-24 – 2017-07-25 (×2): 2 g via INTRAVENOUS
  Filled 2017-07-24 (×2): qty 100

## 2017-07-24 MED ORDER — MAGNESIUM SULFATE 2 GM/50ML IV SOLN: 2.0000 g | Freq: Every day | INTRAVENOUS | Status: DC | PRN

## 2017-07-24 MED ORDER — DOCUSATE SODIUM 100 MG PO CAPS
100.0000 mg | ORAL_CAPSULE | Freq: Every day | ORAL | Status: DC
  Administered 2017-07-25: 100 mg via ORAL
  Filled 2017-07-24: qty 1

## 2017-07-24 MED ORDER — HEPARIN SODIUM (PORCINE) 1000 UNIT/ML IJ SOLN
INTRAMUSCULAR | Status: DC | PRN
  Administered 2017-07-24: 5000 [IU] via INTRAVENOUS

## 2017-07-24 MED ORDER — CHLORHEXIDINE GLUCONATE CLOTH 2 % EX PADS
6.0000 | MEDICATED_PAD | Freq: Once | CUTANEOUS | Status: AC
  Administered 2017-07-24: 6 via TOPICAL

## 2017-07-24 MED ORDER — CEFAZOLIN SODIUM-DEXTROSE 2-4 GM/100ML-% IV SOLN
2.0000 g | INTRAVENOUS | Status: AC
  Administered 2017-07-24: 2 g via INTRAVENOUS

## 2017-07-24 MED ORDER — PHENOL 1.4 % MT LIQD
1.0000 | OROMUCOSAL | Status: DC | PRN
  Filled 2017-07-24: qty 177

## 2017-07-24 MED ORDER — METOPROLOL TARTRATE 5 MG/5ML IV SOLN: 2.0000 mg | INTRAVENOUS | Status: DC | PRN

## 2017-07-24 MED ORDER — FENTANYL CITRATE (PF) 100 MCG/2ML IJ SOLN: 25.0000 ug | INTRAMUSCULAR | Status: DC | PRN

## 2017-07-24 MED ORDER — HEPARIN SODIUM (PORCINE) 1000 UNIT/ML IJ SOLN
INTRAMUSCULAR | Status: AC
  Filled 2017-07-24: qty 1

## 2017-07-24 MED ORDER — LIDOCAINE HCL (PF) 2 % IJ SOLN
INTRAMUSCULAR | Status: AC
  Filled 2017-07-24: qty 10

## 2017-07-24 MED ORDER — SODIUM CHLORIDE FLUSH 0.9 % IV SOLN
INTRAVENOUS | Status: AC
  Administered 2017-07-24: 13:00:00
  Filled 2017-07-24: qty 10

## 2017-07-24 MED ORDER — NITROGLYCERIN IN D5W 200-5 MCG/ML-% IV SOLN
INTRAVENOUS | Status: AC
  Filled 2017-07-24: qty 250

## 2017-07-24 MED ORDER — NITROGLYCERIN IN D5W 200-5 MCG/ML-% IV SOLN: 5.0000 ug/min | INTRAVENOUS | Status: DC

## 2017-07-24 MED ORDER — LIDOCAINE HCL (CARDIAC) PF 100 MG/5ML IV SOSY
PREFILLED_SYRINGE | INTRAVENOUS | Status: DC | PRN
  Administered 2017-07-24: 60 mg via INTRAVENOUS

## 2017-07-24 MED ORDER — HEPARIN (PORCINE) IN NACL 1000-0.9 UT/500ML-% IV SOLN
INTRAVENOUS | Status: AC
  Filled 2017-07-24: qty 1500

## 2017-07-24 MED ORDER — HEPARIN SODIUM (PORCINE) 10000 UNIT/ML IJ SOLN
INTRAMUSCULAR | Status: AC
  Filled 2017-07-24: qty 1

## 2017-07-24 MED ORDER — SUGAMMADEX SODIUM 200 MG/2ML IV SOLN
INTRAVENOUS | Status: AC
  Filled 2017-07-24: qty 2

## 2017-07-24 MED ORDER — DEXAMETHASONE SODIUM PHOSPHATE 10 MG/ML IJ SOLN
INTRAMUSCULAR | Status: DC | PRN
  Administered 2017-07-24: 10 mg via INTRAVENOUS

## 2017-07-24 MED ORDER — HYDRALAZINE HCL 20 MG/ML IJ SOLN
INTRAMUSCULAR | Status: AC
  Administered 2017-07-24: 5 mg via INTRAVENOUS
  Filled 2017-07-24: qty 1

## 2017-07-24 MED ORDER — PRAVASTATIN SODIUM 20 MG PO TABS
20.0000 mg | ORAL_TABLET | Freq: Every day | ORAL | Status: DC
  Filled 2017-07-24: qty 1

## 2017-07-24 MED ORDER — SUGAMMADEX SODIUM 200 MG/2ML IV SOLN
INTRAVENOUS | Status: DC | PRN
  Administered 2017-07-24: 200 mg via INTRAVENOUS

## 2017-07-24 MED ORDER — POTASSIUM CHLORIDE CRYS ER 20 MEQ PO TBCR: 20.0000 meq | EXTENDED_RELEASE_TABLET | Freq: Every day | ORAL | Status: DC | PRN

## 2017-07-24 MED ORDER — PROPOFOL 10 MG/ML IV BOLUS
INTRAVENOUS | Status: AC
  Filled 2017-07-24: qty 20

## 2017-07-24 MED ORDER — PHENYLEPHRINE HCL 10 MG/ML IJ SOLN
INTRAMUSCULAR | Status: AC
  Filled 2017-07-24: qty 1

## 2017-07-24 SURGICAL SUPPLY — 39 items
BLADE SURG 15 STRL LF DISP TIS (BLADE) ×1 IMPLANT
BLADE SURG 15 STRL SS (BLADE) ×2
BOOT SUTURE AID YELLOW STND (SUTURE) ×3 IMPLANT
CATH ACCU-VU SIZ PIG 5F 70CM (CATHETERS) ×6 IMPLANT
CATH BALLN CODA 9X100X32 (BALLOONS) ×3 IMPLANT
CATH BEACON 5 .035 65 KMP TIP (CATHETERS) ×3 IMPLANT
DERMABOND ADVANCED (GAUZE/BANDAGES/DRESSINGS) ×2
DERMABOND ADVANCED .7 DNX12 (GAUZE/BANDAGES/DRESSINGS) ×1 IMPLANT
DEVICE CLOSURE PERCLS PRGLD 6F (VASCULAR PRODUCTS) ×6 IMPLANT
DEVICE PRESTO INFLATION (MISCELLANEOUS) ×3 IMPLANT
DEVICE SAFEGUARD 24CM (GAUZE/BANDAGES/DRESSINGS) ×6 IMPLANT
DEVICE TORQUE .025-.038 (MISCELLANEOUS) ×3 IMPLANT
DRYSEAL FLEXSHEATH 12FR 33CM (SHEATH) ×2
DRYSEAL FLEXSHEATH 18FR 33CM (SHEATH) ×2
EXCLUDER TNK LEG 28MX12X16 (Endovascular Graft) ×1 IMPLANT
EXCLUDER TRUNK LEG 28MX12X16 (Endovascular Graft) ×3 IMPLANT
GLIDEWIRE STIFF .35X180X3 HYDR (WIRE) ×3 IMPLANT
GRAFT EXCLUDER LEG 12X12 (Endovascular Graft) ×3 IMPLANT
LOOP RED MAXI  1X406MM (MISCELLANEOUS) ×2
LOOP VESSEL MAXI 1X406 RED (MISCELLANEOUS) ×1 IMPLANT
LOOP VESSEL MINI 0.8X406 BLUE (MISCELLANEOUS) ×1 IMPLANT
LOOPS BLUE MINI 0.8X406MM (MISCELLANEOUS) ×2
NEEDLE ENTRY 21GA 7CM ECHOTIP (NEEDLE) ×3 IMPLANT
PACK ANGIOGRAPHY (CUSTOM PROCEDURE TRAY) ×3 IMPLANT
PERCLOSE PROGLIDE 6F (VASCULAR PRODUCTS) ×18
SET INTRO CAPELLA COAXIAL (SET/KITS/TRAYS/PACK) ×3 IMPLANT
SHEATH 9FRX11 (SHEATH) ×3 IMPLANT
SHEATH BRITE TIP 6FRX11 (SHEATH) ×6 IMPLANT
SHEATH BRITE TIP 8FRX11 (SHEATH) ×3 IMPLANT
SHEATH DRYSEAL FLEX 12FR 33CM (SHEATH) ×1 IMPLANT
SHEATH DRYSEAL FLEX 18FR 33CM (SHEATH) ×1 IMPLANT
SUT MNCRL+ 5-0 UNDYED PC-3 (SUTURE) ×1 IMPLANT
SUT MONOCRYL 5-0 (SUTURE) ×2
SUT PROLENE 5 0 RB 1 DA (SUTURE) ×3 IMPLANT
SUT PROLENE 6 0 BV (SUTURE) IMPLANT
SYR MEDRAD MARK V 150ML (SYRINGE) ×3 IMPLANT
TUBING CONTRAST HIGH PRESS 72 (TUBING) ×3 IMPLANT
WIRE AMPLATZ SSTIFF .035X260CM (WIRE) ×6 IMPLANT
WIRE J 3MM .035X145CM (WIRE) ×6 IMPLANT

## 2017-07-24 NOTE — Anesthesia Procedure Notes (Signed)
Procedure Name: Intubation Date/Time: 07/24/2017 7:59 AM Performed by: Eben Burow, CRNA Pre-anesthesia Checklist: Patient identified, Emergency Drugs available, Suction available, Patient being monitored and Timeout performed Patient Re-evaluated:Patient Re-evaluated prior to induction Oxygen Delivery Method: Circle system utilized Preoxygenation: Pre-oxygenation with 100% oxygen Induction Type: IV induction Ventilation: Mask ventilation without difficulty and Oral airway inserted - appropriate to patient size Laryngoscope Size: Mac and 4 Grade View: Grade I Tube type: Oral Tube size: 8.0 mm Number of attempts: 1 Airway Equipment and Method: Stylet Placement Confirmation: ETT inserted through vocal cords under direct vision,  positive ETCO2 and breath sounds checked- equal and bilateral Secured at: 22 cm Tube secured with: Tape Dental Injury: Teeth and Oropharynx as per pre-operative assessment

## 2017-07-24 NOTE — Progress Notes (Signed)
Received zofran   vomitted approx 200cc of green   Dr schnier checked pt sites  Aware that pt heart rate running 48 to 52  Blood pressure 186/69 and that pt trying to get up out of bed

## 2017-07-24 NOTE — Anesthesia Post-op Follow-up Note (Signed)
Anesthesia QCDR form completed.        

## 2017-07-24 NOTE — Progress Notes (Signed)
Increased ntg to 1710mcg/min

## 2017-07-24 NOTE — Progress Notes (Signed)
Received hydralazine 5mg  for blood pressure 186/69

## 2017-07-24 NOTE — Progress Notes (Signed)
Blood pressure 157/77

## 2017-07-24 NOTE — Op Note (Signed)
    OPERATIVE NOTE   PROCEDURE: 1. US guidance for vascular access, bilateral femoral arteries 2. Catheter placement into aorta from bilateral femoral approaches 3. Placement of a 28 x 12 x 16 C3 Gore Excluder Endoprosthesis main body with a 12 x 12 contralateral limb 4. ProGlide closure devices bilateral femoral arteries  PRE-OPERATIVE DIAGNOSIS: AAA  POST-OPERATIVE DIAGNOSIS: same  SURGEON: Levora DredgeGregory Ira Dougher, MD and Festus BarrenJason Dew, MD - Co-surgeons  ANESTHESIA: general  ESTIMATED BLOOD LOSS: 50 cc  FINDING(S): 1.  AAA  SPECIMEN(S):  none  INDICATIONS:   Raford PitcherKenneth C Holycross is a 77 y.o. y.o. male who presents with abdominal aortic aneurysm that is now greater than 5 cm.  Anatomy is suitable for a endovascular repair.  Risks and benefits of been reviewed all questions answered patient has agreed to proceed with repair of his abdominal aortic aneurysm with a stent graft to prevent lethal rupture..  DESCRIPTION: After obtaining full informed written consent, the patient was brought back to the operating room and placed supine upon the operating table.  The patient received IV antibiotics prior to induction.  After obtaining adequate anesthesia, the patient was prepped and draped in the standard fashion for endovascular AAA repair.  We then began by gaining access to both femoral arteries with US guidance with me working on the patient's left and Dr. Wyn Quakerew working on the patient's right.  The femoral arteries were found to be patent and accessed without difficulty with a needle under ultrasound guidance without difficulty on each side and permanent images were recorded.  We then placed 2 proglide devices on each side in a pre-close fashion and placed 8 French sheaths.  The patient was then given 5000 units of intravenous heparin.   The Pigtail catheter was placed into the aorta from the left side. Using this image, we selected a 28 x 12 x 16 Main body device.  Over a stiff wire, an 4818 French sheath  was placed. The main body was then placed through the 18 French sheath. A Kumpe catheter was placed up the left side and a magnified image at the renal arteries was performed. The main body was then deployed just below the lowest renal artery. The Kumpe catheter was used to cannulate the contralateral gate without difficulty and successful cannulation was confirmed by twirling the pigtail catheter in the main body. We then placed a stiff wire and a retrograde arteriogram was performed through the left femoral sheath. We upsized to the 12 [place] sheath for the contralateral limb and a 12 x 12 limb was selected and deployed. The main body deployment was then completed. Based off the angiographic findings, extension limbs were not necessary.  All junction points and seals zones were treated with the compliant balloon.   The pigtail catheter was then replaced and a completion angiogram was performed.   No endoleak was detected on completion angiography. The renal arteries were found to be widely patent.    At this point we elected to terminate the procedure. We secured the pro glide devices for hemostasis on the femoral arteries. The skin incision was closed with a 4-0 Monocryl. Dermabond and pressure dressing were placed. The patient was taken to the recovery room in stable condition having tolerated the procedure well.  COMPLICATIONS: none  CONDITION: stable  Levora DredgeGregory Jacorian Golaszewski  07/24/2017, 3:51 PM

## 2017-07-24 NOTE — Transfer of Care (Signed)
Immediate Anesthesia Transfer of Care Note  Patient: Jason PitcherKenneth C Maffia  Procedure(s) Performed: ENDOVASCULAR REPAIR/STENT GRAFT (N/A )  Patient Location: PACU  Anesthesia Type:General  Level of Consciousness: awake, alert  and confused  Airway & Oxygen Therapy: Patient Spontanous Breathing and Patient connected to face mask oxygen  Post-op Assessment: Report given to RN and Post -op Vital signs reviewed and stable  Post vital signs: Reviewed and stable  Last Vitals:  Vitals Value Taken Time  BP 191/88 07/24/2017  9:46 AM  Temp    Pulse 66 07/24/2017  9:47 AM  Resp 17 07/24/2017  9:47 AM  SpO2 92 % 07/24/2017  9:47 AM  Vitals shown include unvalidated device data.  Last Pain:  Vitals:   07/24/17 0655  TempSrc: Oral  PainSc: 0-No pain         Complications: No apparent anesthesia complications

## 2017-07-24 NOTE — Anesthesia Preprocedure Evaluation (Signed)
Anesthesia Evaluation  Patient identified by MRN, date of birth, ID band Patient awake    Reviewed: Allergy & Precautions, H&P , NPO status , Patient's Chart, lab work & pertinent test results, reviewed documented beta blocker date and time   History of Anesthesia Complications (+) PONV and history of anesthetic complications  Airway Mallampati: III  TM Distance: >3 FB Neck ROM: limited    Dental  (+) Chipped, Missing, Poor Dentition   Pulmonary neg shortness of breath, COPD, neg recent URI, former smoker,           Cardiovascular Exercise Tolerance: Good (-) hypertension(-) angina+ Peripheral Vascular Disease  (-) CAD, (-) Past MI, (-) Cardiac Stents and (-) CABG (-) dysrhythmias (-) Valvular Problems/Murmurs     Neuro/Psych negative neurological ROS  negative psych ROS   GI/Hepatic negative GI ROS, Neg liver ROS,   Endo/Other  negative endocrine ROS  Renal/GU negative Renal ROS  negative genitourinary   Musculoskeletal   Abdominal   Peds  Hematology negative hematology ROS (+)   Anesthesia Other Findings Past Medical History:   Aneurysm                                                     Hyperlipidemia                                               Glaucoma                                                     Cataract                                                     Hard of hearing                                              PONV (postoperative nausea and vomiting)    Patient has medical clearance for this procedure.                   Reproductive/Obstetrics negative OB ROS                             Anesthesia Physical  Anesthesia Plan  ASA: III  Anesthesia Plan: General   Post-op Pain Management:    Induction: Intravenous  PONV Risk Score and Plan: 3 and Ondansetron and Dexamethasone  Airway Management Planned: Oral ETT  Additional Equipment:   Intra-op Plan:    Post-operative Plan: Extubation in OR  Informed Consent: I have reviewed the patients History and Physical, chart, labs and discussed the procedure including the risks, benefits and alternatives for the proposed anesthesia with the patient or authorized representative who has indicated his/her understanding and acceptance.  Dental Advisory Given  Plan Discussed with: Anesthesiologist, CRNA and Surgeon  Anesthesia Plan Comments:         Anesthesia Quick Evaluation

## 2017-07-24 NOTE — Op Note (Signed)
    OPERATIVE NOTE   PROCEDURE: 1. US guidance for vascular access, bilateral femoral arteries 2. Catheter placement into aorta from bilateral femoral approaches 3. Placement of a 28 mm proximal, 12 mm distal, 16 cm length Gore Excluder Endoprosthesis main body right with a 12 mm diameter by 12 cm length the left contralateral limb 4. ProGlide closure devices bilateral femoral arteries  PRE-OPERATIVE DIAGNOSIS: AAA  POST-OPERATIVE DIAGNOSIS: same  SURGEON: Jason BarrenJason Issabella Rix, MD and Levora DredgeGregory Schnier, MD - Co-surgeons  ANESTHESIA: General  ESTIMATED BLOOD LOSS: 50 cc  FINDING(S): 1.  AAA  SPECIMEN(S):  none  INDICATIONS:   Jason Reese is a 77 y.o. 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 The anatomy was suitable for endovascular repair.  Risks and benefits of repair in an endovascular fashion were discussed and informed consent was obtained.  DESCRIPTION: After obtaining full informed written consent, the patient was brought back to the operating room and placed supine upon the operating table.  The patient received IV antibiotics prior to induction.  After obtaining adequate anesthesia, the patient was prepped and draped in the standard fashion for endovascular AAA repair.  We then began by gaining access to both femoral arteries with US guidance with me working on the right and Dr. Gilda CreaseSchnier working on the left.  The femoral arteries were found to be patent and accessed without difficulty with a needle under ultrasound guidance without difficulty on each side and permanent images were recorded.  We then placed 2 proglide devices on each side in a pre-close fashion and placed 8 French sheaths. The patient was then given 5000 units of intravenous heparin. The Pigtail catheter was placed into the aorta from the left side. Using this image, we selected a 28 mm diameter by 16 cm length right Main body device.  Over a stiff wire, an 7318 French sheath was placed up the right  side. The main body was then placed through the 18 French sheath. A Kumpe catheter was placed up the left side and a magnified image at the renal arteries was performed. The main body was then deployed just below the lowest renal artery. The Kumpe catheter was used to cannulate the contralateral gate without difficulty and successful cannulation was confirmed by twirling the pigtail catheter in the main body. We then placed a stiff wire and a retrograde arteriogram was performed through the left femoral sheath. We upsized to the 12 [place]French sheath for the contralateral limb and a 12 mm diameter by 12 cm length left iliac limb was selected and deployed. The main body deployment was then completed. Based off the angiographic findings, extension limbs were not necessary. All junction points and seals zones were treated with the compliant balloon. The pigtail catheter was then replaced and a completion angiogram was performed.  No endoleak was detected on completion angiography. The renal arteries were found to be widely patent.  Both hypogastric arteries were found to be widely patent. At this point we elected to terminate the procedure. We secured the pro glide devices for hemostasis on the femoral arteries. The skin incision was closed with a 4-0 Monocryl. Dermabond and pressure dressing were placed. The patient was taken to the recovery room in stable condition having tolerated the procedure well.  COMPLICATIONS: none  CONDITION: stable  Jason Reese  07/24/2017, 9:45 AM   This note was created with Dragon Medical transcription system. Any errors in dictation are purely unintentional.

## 2017-07-24 NOTE — Progress Notes (Signed)
Called out front to inform family he was doing well

## 2017-07-24 NOTE — H&P (Signed)
[facility] History & Physical Update  The patient was interviewed and re-examined.  The patient's previous History and Physical has been reviewed and is unchanged.  There is no change in the plan of care. We plan to proceed with the scheduled procedure.  Festus BarrenJason Dew, MD  07/24/2017, 7:33 AM

## 2017-07-24 NOTE — Progress Notes (Signed)
Pt doing much better   Calmer  No more vomiting  Blood pressure 167/79  Heart rate48

## 2017-07-24 NOTE — Progress Notes (Signed)
Pt blood pressure 171/71   Started nitroglycerin at 605mcg/min

## 2017-07-24 NOTE — Progress Notes (Signed)
Received another dose of hydralazine

## 2017-07-25 LAB — BASIC METABOLIC PANEL
Anion gap: 8 (ref 5–15)
BUN: 11 mg/dL (ref 8–23)
CALCIUM: 9 mg/dL (ref 8.9–10.3)
CO2: 22 mmol/L (ref 22–32)
Chloride: 109 mmol/L (ref 98–111)
Creatinine, Ser: 0.62 mg/dL (ref 0.61–1.24)
Glucose, Bld: 97 mg/dL (ref 70–99)
Potassium: 3.8 mmol/L (ref 3.5–5.1)
SODIUM: 139 mmol/L (ref 135–145)

## 2017-07-25 LAB — CBC
HCT: 40 % (ref 40.0–52.0)
Hemoglobin: 13.1 g/dL (ref 13.0–18.0)
MCH: 26.6 pg (ref 26.0–34.0)
MCHC: 32.8 g/dL (ref 32.0–36.0)
MCV: 80.9 fL (ref 80.0–100.0)
Platelets: 170 10*3/uL (ref 150–440)
RBC: 4.94 MIL/uL (ref 4.40–5.90)
RDW: 15.4 % — ABNORMAL HIGH (ref 11.5–14.5)
WBC: 13.9 10*3/uL — ABNORMAL HIGH (ref 3.8–10.6)

## 2017-07-25 MED ORDER — FAMOTIDINE 20 MG PO TABS
20.0000 mg | ORAL_TABLET | Freq: Two times a day (BID) | ORAL | Status: DC
  Administered 2017-07-25: 20 mg via ORAL
  Filled 2017-07-25: qty 1

## 2017-07-25 MED ORDER — TRAMADOL HCL 50 MG PO TABS
50.0000 mg | ORAL_TABLET | Freq: Four times a day (QID) | ORAL | 0 refills | Status: DC | PRN
Start: 2017-07-25 — End: 2018-01-08

## 2017-07-25 MED ORDER — CLOPIDOGREL BISULFATE 75 MG PO TABS: 75.0000 mg | ORAL_TABLET | Freq: Every day | ORAL | 0 refills | Status: DC

## 2017-07-25 NOTE — Anesthesia Postprocedure Evaluation (Signed)
Anesthesia Post Note  Patient: Jason PitcherKenneth C Reese  Procedure(s) Performed: ENDOVASCULAR REPAIR/STENT GRAFT (N/A )  Patient location during evaluation: ICU Anesthesia Type: General Level of consciousness: awake, awake and alert, oriented and patient cooperative Pain management: pain level controlled Vital Signs Assessment: post-procedure vital signs reviewed and stable Respiratory status: spontaneous breathing, nonlabored ventilation and respiratory function stable Cardiovascular status: stable Anesthetic complications: no     Last Vitals:  Vitals:   07/25/17 0500 07/25/17 0600  BP: 139/66 (!) 156/69  Pulse: 66 63  Resp: 13 14  Temp:    SpO2: 92% 95%    Last Pain:  Vitals:   07/25/17 0400  TempSrc: Oral  PainSc: 0-No pain                 Jordani Nunn,  Bernadine Melecio R

## 2017-07-25 NOTE — Discharge Instructions (Signed)
You may remove your groin dressings tomorrow and shower.  Please keep your groins clean and dry. No driving for at least a week. No lifting heavier than 10 pounds until your first follow up visit.

## 2017-07-25 NOTE — Progress Notes (Signed)
PHARMACIST - PHYSICIAN COMMUNICATION  CONCERNING: IV to Oral Route Change Policy  RECOMMENDATION: This patient is receiving famotidine by the intravenous route.  Based on criteria approved by the Pharmacy and Therapeutics Committee, the intravenous medication(s) is/are being converted to the equivalent oral dose form(s).   DESCRIPTION: These criteria include:  The patient is eating (either orally or via tube) and/or has been taking other orally administered medications for a least 24 hours  The patient has no evidence of active gastrointestinal bleeding or impaired GI absorption (gastrectomy, short bowel, patient on TNA or NPO).  If you have questions about this conversion, please contact the Pharmacy Department  []   [phone]( [phone] )  [facility] [x]   [phone]( [phone] )  [facility] []   [phone]( [phone] )  [facility] []   [phone]( [phone] )  [facility] []   [phone]( [phone] )  [facility]   Simpson,Michael L, [facility]RPH 07/25/2017 9:01 AM

## 2017-07-25 NOTE — Discharge Summary (Signed)
[facility]    Discharge Summary  Patient ID:  Jason Reese MRN: [id number] DOB/AGE: 1940-12-28 77 y.o.  Admit date: 07/24/2017 Discharge date: 07/25/2017 Date of Surgery: 07/24/2017 Surgeon: Surgeon(s): Schnier, Latina Craver, MD Annice Needy, MD  Admission Diagnosis: AAA (abdominal aortic aneurysm) without rupture [facility]) [I71.4]  Discharge Diagnoses:  AAA (abdominal aortic aneurysm) without rupture [facility]) [I71.4]  Secondary Diagnoses: Past Medical History:  Diagnosis Date  . Aneurysm (HCC)   . Cataract   . Glaucoma   . Hard of hearing   . History of kidney stones   . Hyperlipidemia   . PONV (postoperative nausea and vomiting)    Procedure(s): ENDOVASCULAR REPAIR/STENT GRAFT  Discharged Condition: good  HPI:  The patient is a 77 year old male with a abdominal aortic aneurysm greater than 7 cm.  On July 24, 2017, the patient underwent US guidance for vascular access, bilateral femoral arteries, Catheter placement into aorta from bilateral femoral approaches, Placement of a 28 mm proximal, 12 mm distal, 16 cm length Gore Excluder Endoprosthesis main body right with a 12 mm diameter by 12 cm length the left contralateral limb with ProGlide closure devices bilateral femoral arteries.  The patient tolerated the procedure well was transferred to the ICU for observation overnight.  The patient's night of surgery was unremarkable.  During the patient's brief operative stay, the patient's Foley was removed, his diet was advanced and his pain was controlled with p.o. medication.  Upon discharge, the patient was ambulating independently.  Hospital Course:  Jason Reese is a 77 y.o. male is S/P  Procedure(s): ENDOVASCULAR REPAIR/STENT GRAFT  Extubated: POD # 0  Physical exam:  A&Ox3, NAD CV: RRR Pulm: CTA Bilaterally Abdomen: Soft. Non-tender, Non-distended, (+) Bowel Sounds Groin: Bilateral incisions - OR dressing in place, clean and dry. No swelling  or drainage noted.  Extremity: Warm, Non-tender. No edema  Post-op wounds clean, dry, intact or healing well  Pt. Ambulating, voiding and taking PO diet without difficulty.  Pt pain controlled with PO pain meds.  Labs as below  Complications:none  Consults:  Treatment Team:  Erin Fulling, MD  Significant Diagnostic Studies: CBC Lab Results  Component Value Date   WBC 13.9 (H) 07/25/2017   HGB 13.1 07/25/2017   HCT 40.0 07/25/2017   MCV 80.9 07/25/2017   PLT 170 07/25/2017   BMET    Component Value Date/Time   NA 139 07/25/2017 0551   K 3.8 07/25/2017 0551   CL 109 07/25/2017 0551   CO2 22 07/25/2017 0551   GLUCOSE 97 07/25/2017 0551   BUN 11 07/25/2017 0551   CREATININE 0.62 07/25/2017 0551   CREATININE 0.98 04/02/2012 0848   CALCIUM 9.0 07/25/2017 0551   GFRNONAA >60 07/25/2017 0551   GFRNONAA >60 04/02/2012 0848   GFRAA >60 07/25/2017 0551   GFRAA >60 04/02/2012 0848   COAG Lab Results  Component Value Date   INR 0.93 07/18/2017   Disposition:  Discharge to :Home  Allergies as of 07/25/2017   No Known Allergies     Medication List    TAKE these medications   acetaminophen 500 MG tablet Commonly known as:  TYLENOL Take 500 mg by mouth every 6 (six) hours as needed.   aspirin EC 81 MG tablet Take 81 mg by mouth.   Aspirin-Caffeine 845-65 MG Pack Take 1 packet by mouth 3 (three) times daily as needed (head ache).   clopidogrel 75 MG tablet Commonly known as:  PLAVIX Take 1 tablet (  75 mg total) by mouth daily.   dorzolamide-timolol 22.3-6.8 MG/ML ophthalmic solution Commonly known as:  COSOPT Place 1 drop into both eyes 2 (two) times daily.   latanoprost 0.005 % ophthalmic solution Commonly known as:  XALATAN Place 1 drop into both eyes at bedtime.   lovastatin 20 MG tablet Commonly known as:  MEVACOR Take 1 tablet by mouth every morning.   sodium-potassium bicarbonate Tbef dissolvable tablet Commonly known as:  ALKA-SELTZER  GOLD Take 1 tablet by mouth daily as needed (for Upset stomach).   traMADol 50 MG tablet Commonly known as:  ULTRAM Take 1 tablet (50 mg total) by mouth every 6 (six) hours as needed for moderate pain or severe pain.   triamcinolone cream 0.1 % Commonly known as:  KENALOG Apply 1 application topically 2 (two) times daily as needed (apply to hands as needed for irritation).      Verbal and written Discharge instructions given to the patient. Wound care per Discharge AVS Follow-up Information    Forever Arechiga, Cala BradfordKimberly A, PA-C Follow up in 1 week(s).   Specialty:  Vascular Surgery Why:  First Post-Op Follow Up Contact information: [address] [place] [address] [phone]          Signed: Tonette LedererKIMBERLY A Keagen Heinlen, PA-C  07/25/2017, 3:50 PM

## 2017-07-25 NOTE — Progress Notes (Signed)
Patient discharged home with wife. Discharge instructions provided, no questions at this time. CCMD notified. Trudee KusterBrandi R Mansfield

## 2017-07-31 ENCOUNTER — Telehealth (INDEPENDENT_AMBULATORY_CARE_PROVIDER_SITE_OTHER): Payer: Self-pay

## 2017-07-31 NOTE — Telephone Encounter (Signed)
Nurse Trula Orehristina called and stated that there was a delay in [facility] getting out to start services for this patient. She states that after finally getting out to see him, they will not be moving forward with serviced because the patient has returned to his prior level of health and his bilateral wounds are closed and he is ambulatory.  She states that this was just a FYI/Heads-up phone call.

## 2017-08-05 ENCOUNTER — Encounter (INDEPENDENT_AMBULATORY_CARE_PROVIDER_SITE_OTHER): Payer: Self-pay | Admitting: Vascular Surgery

## 2017-08-05 ENCOUNTER — Ambulatory Visit (INDEPENDENT_AMBULATORY_CARE_PROVIDER_SITE_OTHER): Payer: Medicare HMO | Admitting: Vascular Surgery

## 2017-08-05 VITALS — BP 159/77 | HR 53 | Resp 13 | Ht 69.0 in | Wt 170.0 lb

## 2017-08-05 DIAGNOSIS — I714 Abdominal aortic aneurysm, without rupture, unspecified: Secondary | ICD-10-CM

## 2017-08-05 DIAGNOSIS — Z8639 Personal history of other endocrine, nutritional and metabolic disease: Secondary | ICD-10-CM

## 2017-08-05 NOTE — Progress Notes (Signed)
Subjective:    Patient ID: Jason Reese, male    DOB: 02-May-1940, 77 y.o.   MRN: [id number] Chief Complaint  Patient presents with  . Follow-up    1 week [facility] follow up   Patient presents for his first postoperative follow-up.  The patient was seen with his wife.  The patient is status post a endovascular AAA repair on July 24, 2017.  The patient reports that his postoperative course has been unremarkable.  The patient denies any abdominal pain, back pain or thrombosis to the bilateral lower extremity.  The patient denies any issues with his bilateral groin incisions.  The patient denies any fever, nausea vomiting.  Review of Systems  Constitutional: Negative.   HENT: Negative.   Eyes: Negative.   Respiratory: Negative.   Cardiovascular: Negative.   Gastrointestinal: Negative.   Endocrine: Negative.   Genitourinary: Negative.   Musculoskeletal: Negative.   Skin: Negative.   Allergic/Immunologic: Negative.   Neurological: Negative.   Hematological: Negative.   Psychiatric/Behavioral: Negative.       Objective:   Physical Exam  Constitutional: He is oriented to person, place, and time. He appears well-developed and well-nourished. No distress.  HENT:  Head: Normocephalic and atraumatic.  Right Ear: External ear normal.  Left Ear: External ear normal.  Eyes: Pupils are equal, round, and reactive to light. Conjunctivae and EOM are normal.  Neck: Normal range of motion.  Cardiovascular: Normal rate, regular rhythm, normal heart sounds and intact distal pulses.  Pulmonary/Chest: Effort normal and breath sounds normal.  Abdominal: Soft. Bowel sounds are normal. He exhibits no distension. There is no tenderness. There is no guarding.  Musculoskeletal: Normal range of motion. He exhibits no edema.  Neurological: He is alert and oriented to person, place, and time.  Skin: Skin is warm and dry. He is not diaphoretic.  Bilateral groin incisions are healing well.  Minimal ecchymosis  to the areas.  Psychiatric: He has a normal mood and affect. His behavior is normal. Judgment and thought content normal.  Vitals reviewed.  BP (!) 159/77 (BP Location: Right Arm, Patient Position: Sitting)   Pulse (!) 53   Resp 13   Ht 5\' 9"  (1.753 m)   Wt 170 lb (77.1 kg)   BMI 25.10 kg/m   Past Medical History:  Diagnosis Date  . Aneurysm (HCC)   . Cataract   . Glaucoma   . Hard of hearing   . History of kidney stones   . Hyperlipidemia   . PONV (postoperative nausea and vomiting)    Social History   Socioeconomic History  . Marital status: Married    Spouse name: Not on file  . Number of children: Not on file  . Years of education: Not on file  . Highest education level: Not on file  Occupational History  . Not on file  Social Needs  . Financial resource strain: Not on file  . Food insecurity:    Worry: Not on file    Inability: Not on file  . Transportation needs:    Medical: Not on file    Non-medical: Not on file  Tobacco Use  . Smoking status: Former Smoker    Last attempt to quit: 07/28/2005    Years since quitting: 12.0  . Smokeless tobacco: Never Used  Substance and Sexual Activity  . Alcohol use: No  . Drug use: No  . Sexual activity: Not on file  Lifestyle  . Physical activity:    Days per week:  Not on file    Minutes per session: Not on file  . Stress: Not on file  Relationships  . Social connections:    Talks on phone: Not on file    Gets together: Not on file    Attends religious service: Not on file    Active member of club or organization: Not on file    Attends meetings of clubs or organizations: Not on file    Relationship status: Not on file  . Intimate partner violence:    Fear of current or ex partner: Not on file    Emotionally abused: Not on file    Physically abused: Not on file    Forced sexual activity: Not on file  Other Topics Concern  . Not on file  Social History Narrative  . Not on file   Past Surgical History:    Procedure Laterality Date  . ENDOVASCULAR REPAIR/STENT GRAFT N/A 07/24/2017   Procedure: ENDOVASCULAR REPAIR/STENT GRAFT;  Surgeon: Annice Needyew, Jason S, MD;  Location: [facility] INVASIVE CV LAB;  Service: Cardiovascular;  Laterality: N/A;  . EYE SURGERY  2013  . INGUINAL HERNIA REPAIR Right 08/25/2014   Procedure: RIGHT INGUINAL HERNIA REPAIR WITH MESH ;  Surgeon: Ida Roguehristopher Lundquist, MD;  Location: [facility] ORS;  Service: General;  Laterality: Right;  . INGUINAL HERNIA REPAIR Left 1980   x 2- Dr. Alan MulderSmidberg  . INGUINAL HERNIA REPAIR Right   . JOINT REPLACEMENT Left 2007   shoulder  . SHOULDER ARTHROSCOPY Left    Family History  Problem Relation Age of Onset  . Diabetes Mother    No Known Allergies     Assessment & Plan:  Patient presents for his first postoperative follow-up.  The patient was seen with his wife.  The patient is status post a endovascular AAA repair on July 24, 2017.  The patient reports that his postoperative course has been unremarkable.  The patient denies any abdominal pain, back pain or thrombosis to the bilateral lower extremity.  The patient denies any issues with his bilateral groin incisions.  The patient denies any fever, nausea vomiting.  1) AAA s/p endovascular repair - New Patient presents today without complaint. Patient notes that his post operative course has been uneventful Physical exam is unremarkable Abdominal exam is benign he has bilateral groin incisions are healing well The patient is to follow-up in 1 month for an EVAR  Current Outpatient Medications on File Prior to Visit  Medication Sig Dispense Refill  . acetaminophen (TYLENOL) 500 MG tablet Take 500 mg by mouth every 6 (six) hours as needed.     Marland Kitchen. aspirin EC 81 MG tablet Take 81 mg by mouth.    . Aspirin-Caffeine 845-65 MG PACK Take 1 packet by mouth 3 (three) times daily as needed (head ache).    . clopidogrel (PLAVIX) 75 MG tablet Take 1 tablet (75 mg total) by mouth daily. 30 tablet 0  .  dorzolamide-timolol (COSOPT) 22.3-6.8 MG/ML ophthalmic solution Place 1 drop into both eyes 2 (two) times daily.     Marland Kitchen. latanoprost (XALATAN) 0.005 % ophthalmic solution Place 1 drop into both eyes at bedtime.  3  . lovastatin (MEVACOR) 20 MG tablet Take 1 tablet by mouth every morning.     . sodium-potassium bicarbonate (ALKA-SELTZER GOLD) TBEF dissolvable tablet Take 1 tablet by mouth daily as needed (for Upset stomach).    . triamcinolone cream (KENALOG) 0.1 % Apply 1 application topically 2 (two) times daily as needed (apply to hands as needed  for irritation).    . traMADol (ULTRAM) 50 MG tablet Take 1 tablet (50 mg total) by mouth every 6 (six) hours as needed for moderate pain or severe pain. (Patient not taking: Reported on 08/05/2017) 15 tablet 0   No current facility-administered medications on file prior to visit.    There are no Patient Instructions on file for this visit. No follow-ups on file.  Mikeyla Music A Jahniya Duzan, PA-C

## 2017-08-16 ENCOUNTER — Telehealth (INDEPENDENT_AMBULATORY_CARE_PROVIDER_SITE_OTHER): Payer: Self-pay | Admitting: Vascular Surgery

## 2017-08-22 ENCOUNTER — Telehealth (INDEPENDENT_AMBULATORY_CARE_PROVIDER_SITE_OTHER): Payer: Self-pay

## 2017-08-22 NOTE — Telephone Encounter (Signed)
Patient want to know if he should get the plavix med refilled since he was to take only for 30 days.

## 2017-08-22 NOTE — Telephone Encounter (Signed)
Patient's wife called and asked about the instructions for the Gas-X tablets.  I called her back and let her know that he needs to take the Gas-X 3 hours before his Ultrasounds here in the office, she was pleased to get this clarification.

## 2017-08-22 NOTE — Telephone Encounter (Signed)
I spoke with Selena BattenKim and she advise for the patient take Plavix as instructed and that was only take for 30 days.I did speak with patient wife and inform her with Selena BattenKim medical advise.

## 2017-10-02 ENCOUNTER — Encounter

## 2017-10-02 ENCOUNTER — Encounter (INDEPENDENT_AMBULATORY_CARE_PROVIDER_SITE_OTHER): Payer: Self-pay

## 2017-10-02 ENCOUNTER — Ambulatory Visit (INDEPENDENT_AMBULATORY_CARE_PROVIDER_SITE_OTHER): Payer: Medicare HMO

## 2017-10-02 ENCOUNTER — Ambulatory Visit (INDEPENDENT_AMBULATORY_CARE_PROVIDER_SITE_OTHER): Payer: Medicare HMO | Admitting: Nurse Practitioner

## 2017-10-02 ENCOUNTER — Encounter (INDEPENDENT_AMBULATORY_CARE_PROVIDER_SITE_OTHER): Payer: Self-pay | Admitting: Vascular Surgery

## 2017-10-02 VITALS — BP 161/78 | HR 46 | Resp 14 | Ht 68.5 in | Wt 170.0 lb

## 2017-10-02 DIAGNOSIS — F17200 Nicotine dependence, unspecified, uncomplicated: Secondary | ICD-10-CM | POA: Diagnosis not present

## 2017-10-02 DIAGNOSIS — E785 Hyperlipidemia, unspecified: Secondary | ICD-10-CM | POA: Diagnosis not present

## 2017-10-02 DIAGNOSIS — I714 Abdominal aortic aneurysm, without rupture, unspecified: Secondary | ICD-10-CM

## 2017-10-02 NOTE — Progress Notes (Signed)
Subjective:    Patient ID: Jason Reese, male    DOB: [birth year], 77 y.o.   MRN: [id number] Chief Complaint  Patient presents with  . Follow-up    1 month EVAR    HPI  Jason Reese is a 77 y.o. male that presents today for follow-up of his EVAR surveillance.  The patient reports no significant issues following surgery.  The patient reports no rest pain, claudication-like symptoms, no ulcerations.  The patient has had no new abdominal pain.  He does not endorse any fever, chills, nausea, vomiting, diarrhea.  He stated that he had very little pain following the procedure and did not need any of his pain medicine.  The patient denies any amaurosis fugax, dizziness, lightheadedness.  The patient underwent an Ivar duplex today which revealed the largest diameter of 4.2 cm, which is decreased from the prior exam.  The previous diameter measurement was 5.2 cm.  Constitutional: [] Weight loss  [] Fever  [] Chills Cardiac: [] Chest pain   [] Chest pressure   [] Palpitations   [] Shortness of breath when laying flat   [] Shortness of breath with exertion. Vascular:  [] Pain in legs with walking   [] Pain in legs with standing  [] History of DVT   [] Phlebitis   [] Swelling in legs   [] Varicose veins   [] Non-healing ulcers Pulmonary:   [] Uses home oxygen   [] Productive cough   [] Hemoptysis   [] Wheeze  [] COPD   [] Asthma Neurologic:  [] Dizziness   [] Seizures   [] History of stroke   [] History of TIA  [] Aphasia   [] Vissual changes   [] Weakness or numbness in arm   [] Weakness or numbness in leg Musculoskeletal:   [] Joint swelling   [] Joint pain   [] Low back pain Hematologic:  [] Easy bruising  [] Easy bleeding   [] Hypercoagulable state   [] Anemic Gastrointestinal:  [] Diarrhea   [] Vomiting  [] Gastroesophageal reflux/heartburn   [] Difficulty swallowing. Genitourinary:  [] Chronic kidney disease   [] Difficult urination  [] Frequent urination   [] Blood in urine Skin:  [] Rashes   [] Ulcers  Psychological:  [] History of  anxiety   []  History of major depression.     Objective:   Physical Exam  BP (!) 161/78 (BP Location: Right Arm, Patient Position: Sitting)   Pulse (!) 46   Resp 14   Ht 5' 8.5" (1.74 m)   Wt 170 lb (77.1 kg)   BMI 25.47 kg/m   Past Medical History:  Diagnosis Date  . Aneurysm (HCC)   . Cataract   . Glaucoma   . Hard of hearing   . History of kidney stones   . Hyperlipidemia   . PONV (postoperative nausea and vomiting)      Gen: WD/WN, NAD Head: [place]/AT, No temporalis wasting.  Ear/Nose/Throat: Hard of hearing, nares w/o erythema or drainage Eyes: PER, EOMI, sclera nonicteric.  Neck: Supple, no masses.  No JVD.  Pulmonary:  Good air movement, no use of accessory muscles.  Cardiac: RRR Vascular:  Vessel Right Left  Radial  palpable  palpable  Gastrointestinal: soft, non-distended. No guarding/no peritoneal signs.  Musculoskeletal: M/S 5/5 throughout.  No deformity or atrophy.  Neurologic: Pain and light touch intact in extremities.  Symmetrical.  Speech is fluent. Motor exam as listed above. Psychiatric: Judgment intact, Mood & affect appropriate for pt's clinical situation. Dermatologic: No Venous rashes. No Ulcers Noted.  No changes consistent with cellulitis. Lymph : No Cervical lymphadenopathy, no lichenification or skin changes of chronic lymphedema.   Social History   Socioeconomic History  .  Marital status: Married    Spouse name: Not on file  . Number of children: Not on file  . Years of education: Not on file  . Highest education level: Not on file  Occupational History  . Not on file  Social Needs  . Financial resource strain: Not on file  . Food insecurity:    Worry: Not on file    Inability: Not on file  . Transportation needs:    Medical: Not on file    Non-medical: Not on file  Tobacco Use  . Smoking status: Former Smoker    Last attempt to quit: 07/28/2005    Years since quitting: 12.1  . Smokeless tobacco: Never Used  Substance and Sexual  Activity  . Alcohol use: No  . Drug use: No  . Sexual activity: Not on file  Lifestyle  . Physical activity:    Days per week: Not on file    Minutes per session: Not on file  . Stress: Not on file  Relationships  . Social connections:    Talks on phone: Not on file    Gets together: Not on file    Attends religious service: Not on file    Active member of club or organization: Not on file    Attends meetings of clubs or organizations: Not on file    Relationship status: Not on file  . Intimate partner violence:    Fear of current or ex partner: Not on file    Emotionally abused: Not on file    Physically abused: Not on file    Forced sexual activity: Not on file  Other Topics Concern  . Not on file  Social History Narrative  . Not on file    Past Surgical History:  Procedure Laterality Date  . ENDOVASCULAR REPAIR/STENT GRAFT N/A 07/24/2017   Procedure: ENDOVASCULAR REPAIR/STENT GRAFT;  Surgeon: Annice Needy, MD;  Location: [facility];  Service: Cardiovascular;  Laterality: N/A;  . EYE SURGERY  2013  . INGUINAL HERNIA REPAIR Right 08/25/2014   Procedure: RIGHT INGUINAL HERNIA REPAIR WITH MESH ;  Surgeon: Ida Rogue, MD;  Location: [facility];  Service: General;  Laterality: Right;  . INGUINAL HERNIA REPAIR Left 1980   x 2- Dr. Alan Mulder  . INGUINAL HERNIA REPAIR Right   . JOINT REPLACEMENT Left 2007   shoulder  . SHOULDER ARTHROSCOPY Left     Family History  Problem Relation Age of Onset  . Diabetes Mother     No Known Allergies     Assessment & Plan:   1. AAA (abdominal aortic aneurysm) without rupture (HCC) The patient underwent an Evar duplex today which revealed the largest diameter of 4.2 cm, which is decreased from the prior exam.  The previous diameter measurement was 5.2 cm.  Recommend: Patient is status post successful endovascular repair of the AAA.   No further intervention is required at this time.   No endoleak is detected and  the aneurysm sac is stable.  The patient will continue antiplatelet therapy as prescribed as well as aggressive management of hyperlipidemia. Exercise is again strongly encouraged.   However, endografts require continued surveillance with ultrasound or CT scan. This is mandatory to detect any changes that allow repressurization of the aneurysm sac.  The patient is informed that this would be asymptomatic.  The patient is reminded that lifelong routine surveillance is a necessity with an endograft. Patient will continue to follow-up at 6 month intervals with ultrasound of the  aorta.  - VAS [place] EVAR DUPLEX; Future  2. Hyperlipidemia, unspecified hyperlipidemia type Continue statin as ordered and reviewed, no changes at this time   3. Smoker Advised to continue smoking cessation efforts   Current Outpatient Medications on File Prior to Visit  Medication Sig Dispense Refill  . aspirin EC 81 MG tablet Take 81 mg by mouth.    Marland Kitchen augmented betamethasone dipropionate (DIPROLENE-AF) 0.05 % ointment APPLY TOPICAL 2(TWO) TIMES A DAY  0  . dorzolamide-timolol (COSOPT) 22.3-6.8 MG/ML ophthalmic solution Place 1 drop into both eyes 2 (two) times daily.     Marland Kitchen latanoprost (XALATAN) 0.005 % ophthalmic solution Place 1 drop into both eyes at bedtime.  3  . lovastatin (MEVACOR) 20 MG tablet Take 1 tablet by mouth every morning.     Marland Kitchen acetaminophen (TYLENOL) 500 MG tablet Take 500 mg by mouth every 6 (six) hours as needed.     . clopidogrel (PLAVIX) 75 MG tablet TAKE 1 TABLET BY MOUTH DAILY (Patient not taking: Reported on 10/02/2017) 30 tablet 0  . sodium-potassium bicarbonate (ALKA-SELTZER GOLD) TBEF dissolvable tablet Take 1 tablet by mouth daily as needed (for Upset stomach).    . traMADol (ULTRAM) 50 MG tablet Take 1 tablet (50 mg total) by mouth every 6 (six) hours as needed for moderate pain or severe pain. (Patient not taking: Reported on 10/02/2017) 15 tablet 0  . triamcinolone cream (KENALOG) 0.1 %  Apply 1 application topically 2 (two) times daily as needed (apply to hands as needed for irritation).     No current facility-administered medications on file prior to visit.     There are no Patient Instructions on file for this visit. No follow-ups on file.   Georgiana Spinner, NP

## 2018-01-08 ENCOUNTER — Encounter (INDEPENDENT_AMBULATORY_CARE_PROVIDER_SITE_OTHER): Payer: Self-pay | Admitting: Nurse Practitioner

## 2018-01-08 ENCOUNTER — Ambulatory Visit (INDEPENDENT_AMBULATORY_CARE_PROVIDER_SITE_OTHER): Payer: Medicare HMO | Admitting: Nurse Practitioner

## 2018-01-08 ENCOUNTER — Ambulatory Visit (INDEPENDENT_AMBULATORY_CARE_PROVIDER_SITE_OTHER): Payer: Medicare HMO

## 2018-01-08 ENCOUNTER — Ambulatory Visit (INDEPENDENT_AMBULATORY_CARE_PROVIDER_SITE_OTHER): Payer: Medicare HMO | Admitting: Vascular Surgery

## 2018-01-08 ENCOUNTER — Other Ambulatory Visit (INDEPENDENT_AMBULATORY_CARE_PROVIDER_SITE_OTHER): Payer: Medicare HMO

## 2018-01-08 VITALS — BP 175/78 | HR 58 | Ht 67.0 in | Wt 173.0 lb

## 2018-01-08 DIAGNOSIS — I714 Abdominal aortic aneurysm, without rupture, unspecified: Secondary | ICD-10-CM

## 2018-01-08 DIAGNOSIS — J432 Centrilobular emphysema: Secondary | ICD-10-CM

## 2018-01-08 DIAGNOSIS — E785 Hyperlipidemia, unspecified: Secondary | ICD-10-CM | POA: Diagnosis not present

## 2018-01-08 NOTE — Progress Notes (Signed)
Subjective:    Patient ID: Jason Reese, male    DOB: May 03, 1940, 77 y.o.   MRN: [id number] Chief Complaint  Patient presents with  . Follow-up    3 month evar    HPI  Jason Reese is a 77 y.o. male The patient returns to the office for surveillance of an abdominal aortic aneurysm status post stent graft placement on 07/22/2017.   Patient denies abdominal pain or back pain, no other abdominal complaints. No groin related complaints. No symptoms consistent with distal embolization No changes in claudication distance.   There have been no interval changes in his overall healthcare since his last visit.   Patient denies amaurosis fugax or TIA symptoms. There is no history of claudication or rest pain symptoms of the lower extremities. The patient denies angina or shortness of breath.   Duplex US of the aorta and iliac arteries shows a 3.3 cm AAA sac with no endoleak, compared to study done on 10/02/2017 the largest measurement was 4.2. Past Medical History:  Diagnosis Date  . Aneurysm (HCC)   . Cataract   . Glaucoma   . Hard of hearing   . History of kidney stones   . Hyperlipidemia   . PONV (postoperative nausea and vomiting)     Past Surgical History:  Procedure Laterality Date  . ENDOVASCULAR REPAIR/STENT GRAFT N/A 07/24/2017   Procedure: ENDOVASCULAR REPAIR/STENT GRAFT;  Surgeon: Annice Needy, MD;  Location: [facility];  Service: Cardiovascular;  Laterality: N/A;  . EYE SURGERY  2013  . INGUINAL HERNIA REPAIR Right 08/25/2014   Procedure: RIGHT INGUINAL HERNIA REPAIR WITH MESH ;  Surgeon: Ida Rogue, MD;  Location: [facility] OR[facility];  Service: General;  Laterality: Right;  . INGUINAL HERNIA REPAIR Left 1980   x 2- Dr. Alan Mulder  . INGUINAL HERNIA REPAIR Right   . JOINT REPLACEMENT Left 2007   shoulder  . SHOULDER ARTHROSCOPY Left     Social History   Socioeconomic History  . Marital status: Married    Spouse name: Not on file  . Number of children:  Not on file  . Years of education: Not on file  . Highest education level: Not on file  Occupational History  . Not on file  Social Needs  . Financial resource strain: Not on file  . Food insecurity:    Worry: Not on file    Inability: Not on file  . Transportation needs:    Medical: Not on file    Non-medical: Not on file  Tobacco Use  . Smoking status: Former Smoker    Last attempt to quit: 07/28/2005    Years since quitting: 12.4  . Smokeless tobacco: Never Used  Substance and Sexual Activity  . Alcohol use: No  . Drug use: No  . Sexual activity: Not on file  Lifestyle  . Physical activity:    Days per week: Not on file    Minutes per session: Not on file  . Stress: Not on file  Relationships  . Social connections:    Talks on phone: Not on file    Gets together: Not on file    Attends religious service: Not on file    Active member of club or organization: Not on file    Attends meetings of clubs or organizations: Not on file    Relationship status: Not on file  . Intimate partner violence:    Fear of current or ex partner: Not on file  Emotionally abused: Not on file    Physically abused: Not on file    Forced sexual activity: Not on file  Other Topics Concern  . Not on file  Social History Narrative  . Not on file    Family History  Problem Relation Age of Onset  . Diabetes Mother     No Known Allergies   Review of Systems   Review of Systems: Negative Unless Checked Constitutional: [] Weight loss  [] Fever  [] Chills Cardiac: [] Chest pain   []  Atrial Fibrillation  [] Palpitations   [] Shortness of breath when laying flat   [] Shortness of breath with exertion. Vascular:  [] Pain in legs with walking   [] Pain in legs with standing  [] History of DVT   [] Phlebitis   [] Swelling in legs   [] Varicose veins   [] Non-healing ulcers Pulmonary:   [] Uses home oxygen   [] Productive cough   [] Hemoptysis   [] Wheeze  [x] COPD   [] Asthma Neurologic:  [] Dizziness    [] Seizures   [] History of stroke   [] History of TIA  [] Aphasia   [] Vissual changes   [] Weakness or numbness in arm   [] Weakness or numbness in leg Musculoskeletal:   [] Joint swelling   [] Joint pain   [] Low back pain  []  History of Knee Replacement Hematologic:  [] Easy bruising  [] Easy bleeding   [] Hypercoagulable state   [] Anemic Gastrointestinal:  [] Diarrhea   [] Vomiting  [] Gastroesophageal reflux/heartburn   [] Difficulty swallowing. Genitourinary:  [] Chronic kidney disease   [] Difficult urination  [] Anuric   [] Blood in urine Skin:  [] Rashes   [] Ulcers  Psychological:  [] History of anxiety   []  History of major depression  []  Memory Difficulties     Objective:   Physical Exam  BP (!) 175/78 (BP Location: Right Arm, Patient Position: Sitting)   Pulse (!) 58   Ht 5\' 7"  (1.702 m)   Wt 173 lb (78.5 kg)   SpO2 (!) 16%   BMI 27.10 kg/m   Gen: WD/WN, NAD Head: [place]/AT, No temporalis wasting.  Ear/Nose/Throat: Hearing grossly intact, nares w/o erythema or drainage Eyes: PER, EOMI, sclera nonicteric.  Neck: Supple, no masses.  No JVD.  Pulmonary:  Good air movement, no use of accessory muscles.  Cardiac: RRR Vascular:  Vessel Right Left  Radial Palpable Palpable   Gastrointestinal: soft, non-distended. No guarding/no peritoneal signs.  Musculoskeletal: M/S 5/5 throughout.  No deformity or atrophy.  Neurologic: Pain and light touch intact in extremities.  Symmetrical.  Speech is fluent. Motor exam as listed above. Psychiatric: Judgment intact, Mood & affect appropriate for pt's clinical situation. Dermatologic: No Venous rashes. No Ulcers Noted.  No changes consistent with cellulitis. Lymph : No Cervical lymphadenopathy, no lichenification or skin changes of chronic lymphedema.      Assessment & Plan:   1. AAA (abdominal aortic aneurysm) without rupture [facility](HCC) Patient reports no ongoing issues following AAA repair.  His blood pressure little bit elevated today and I have advised him to  visit his primary care provider as he is currently not on any blood pressure medications.  He has a follow-up appointment with him in early January.  Recommend: Patient is status post successful endovascular repair of the AAA.   No further intervention is required at this time.   No endoleak is detected and the aneurysm sac is stable.  The patient will continue antiplatelet therapy as prescribed as well as aggressive management of hyperlipidemia. Exercise is again strongly encouraged.   However, endografts require continued surveillance with ultrasound or CT scan.  This is mandatory to detect any changes that allow repressurization of the aneurysm sac.  The patient is informed that this would be asymptomatic.  The patient is reminded that lifelong routine surveillance is a necessity with an endograft. Patient will continue to follow-up in  6 months with ultrasound of the aorta. - VAS [place] EVAR DUPLEX; Future  2. Hyperlipidemia, unspecified hyperlipidemia type Continue statin as ordered and reviewed, no changes at this time   3. Centrilobular emphysema (HCC) Continue pulmonary medications and aerosols as already ordered, these medications have been reviewed and there are no changes at this time.      Current Outpatient Medications on File Prior to Visit  Medication Sig Dispense Refill  . acetaminophen (TYLENOL) 500 MG tablet Take 500 mg by mouth every 6 (six) hours as needed.     Marland Kitchen aspirin EC 81 MG tablet Take 81 mg by mouth.    Marland Kitchen augmented betamethasone dipropionate (DIPROLENE-AF) 0.05 % ointment APPLY TOPICAL 2(TWO) TIMES A DAY  0  . dorzolamide-timolol (COSOPT) 22.3-6.8 MG/ML ophthalmic solution Place 1 drop into both eyes 2 (two) times daily.     Marland Kitchen latanoprost (XALATAN) 0.005 % ophthalmic solution Place 1 drop into both eyes at bedtime.  3  . lovastatin (MEVACOR) 20 MG tablet Take 1 tablet by mouth every morning.      No current facility-administered medications on file prior to  visit.     There are no Patient Instructions on file for this visit. No follow-ups on file.   Georgiana Spinner, NP  This note was completed with Office manager.  Any errors are purely unintentional.

## 2018-07-10 ENCOUNTER — Encounter (INDEPENDENT_AMBULATORY_CARE_PROVIDER_SITE_OTHER): Payer: Self-pay | Admitting: Nurse Practitioner

## 2018-07-10 ENCOUNTER — Ambulatory Visit (INDEPENDENT_AMBULATORY_CARE_PROVIDER_SITE_OTHER): Payer: Medicare HMO | Admitting: Nurse Practitioner

## 2018-07-10 ENCOUNTER — Other Ambulatory Visit: Payer: Self-pay

## 2018-07-10 ENCOUNTER — Ambulatory Visit (INDEPENDENT_AMBULATORY_CARE_PROVIDER_SITE_OTHER): Payer: Medicare HMO

## 2018-07-10 VITALS — BP 191/93 | HR 67 | Resp 16 | Wt 176.0 lb

## 2018-07-10 DIAGNOSIS — I714 Abdominal aortic aneurysm, without rupture, unspecified: Secondary | ICD-10-CM

## 2018-07-10 DIAGNOSIS — Z7902 Long term (current) use of antithrombotics/antiplatelets: Secondary | ICD-10-CM

## 2018-07-10 DIAGNOSIS — Z79899 Other long term (current) drug therapy: Secondary | ICD-10-CM | POA: Diagnosis not present

## 2018-07-10 DIAGNOSIS — E785 Hyperlipidemia, unspecified: Secondary | ICD-10-CM | POA: Diagnosis not present

## 2018-07-10 DIAGNOSIS — F17200 Nicotine dependence, unspecified, uncomplicated: Secondary | ICD-10-CM

## 2018-07-10 NOTE — Progress Notes (Signed)
SUBJECTIVE:  Patient ID: Jason Reese, male    DOB: 02/14/1940, 78 y.o.   MRN: [id number] Chief Complaint  Patient presents with  . Follow-up    73month EVAR    HPI  Jason Reese is a 78 y.o. male The patient returns to the office for surveillance of an abdominal aortic aneurysm status post stent graft placement on 07/24/2017.   Patient denies abdominal pain or back pain, no other abdominal complaints. No groin related complaints. No symptoms consistent with distal embolization No changes in claudication distance.   There have been no interval changes in his overall healthcare since his last visit.   Patient denies amaurosis fugax or TIA symptoms. There is no history of claudication or rest pain symptoms of the lower extremities. The patient denies angina or shortness of breath.   Duplex US of the aorta and iliac arteries shows a 2.3 AAA sac with no endoleak, a decrease in the sac compared to the previous study.  Past Medical History:  Diagnosis Date  . Aneurysm (HCC)   . Cataract   . Glaucoma   . Hard of hearing   . History of kidney stones   . Hyperlipidemia   . PONV (postoperative nausea and vomiting)     Past Surgical History:  Procedure Laterality Date  . ENDOVASCULAR REPAIR/STENT GRAFT N/A 07/24/2017   Procedure: ENDOVASCULAR REPAIR/STENT GRAFT;  Surgeon: Annice Needyew, Jason S, MD;  Location: [facility];  Service: Cardiovascular;  Laterality: N/A;  . EYE SURGERY  2013  . INGUINAL HERNIA REPAIR Right 08/25/2014   Procedure: RIGHT INGUINAL HERNIA REPAIR WITH MESH ;  Surgeon: Ida Roguehristopher Lundquist, MD;  Location: [facility] OR[facility];  Service: General;  Laterality: Right;  . INGUINAL HERNIA REPAIR Left 1980   x 2- Dr. Alan MulderSmidberg  . INGUINAL HERNIA REPAIR Right   . JOINT REPLACEMENT Left 2007   shoulder  . SHOULDER ARTHROSCOPY Left     Social History   Socioeconomic History  . Marital status: Married    Spouse name: Not on file  . Number of children: Not on file  .  Years of education: Not on file  . Highest education level: Not on file  Occupational History  . Not on file  Social Needs  . Financial resource strain: Not on file  . Food insecurity    Worry: Not on file    Inability: Not on file  . Transportation needs    Medical: Not on file    Non-medical: Not on file  Tobacco Use  . Smoking status: Former Smoker    Quit date: 07/28/2005    Years since quitting: 12.9  . Smokeless tobacco: Never Used  Substance and Sexual Activity  . Alcohol use: No  . Drug use: No  . Sexual activity: Not on file  Lifestyle  . Physical activity    Days per week: Not on file    Minutes per session: Not on file  . Stress: Not on file  Relationships  . Social Musicianconnections    Talks on phone: Not on file    Gets together: Not on file    Attends religious service: Not on file    Active member of club or organization: Not on file    Attends meetings of clubs or organizations: Not on file    Relationship status: Not on file  . Intimate partner violence    Fear of current or ex partner: Not on file    Emotionally abused: Not on file  Physically abused: Not on file    Forced sexual activity: Not on file  Other Topics Concern  . Not on file  Social History Narrative  . Not on file    Family History  Problem Relation Age of Onset  . Diabetes Mother     No Known Allergies   Review of Systems   Review of Systems: Negative Unless Checked Constitutional: [] Weight loss  [] Fever  [] Chills Cardiac: [] Chest pain   []  Atrial Fibrillation  [] Palpitations   [] Shortness of breath when laying flat   [] Shortness of breath with exertion. [] Shortness of breath at rest Vascular:  [] Pain in legs with walking   [] Pain in legs with standing [] Pain in legs when laying flat   [] Claudication    [] Pain in feet when laying flat    [] History of DVT   [] Phlebitis   [] Swelling in legs   [] Varicose veins   [] Non-healing ulcers Pulmonary:   [] Uses home oxygen   [] Productive  cough   [] Hemoptysis   [] Wheeze  [x] COPD   [] Asthma Neurologic:  [] Dizziness   [] Seizures  [] Blackouts [] History of stroke   [] History of TIA  [] Aphasia   [] Temporary Blindness   [] Weakness or numbness in arm   [] Weakness or numbness in leg Musculoskeletal:   [] Joint swelling   [] Joint pain   [] Low back pain  []  History of Knee Replacement [] Arthritis [] back Surgeries  []  Spinal Stenosis    Hematologic:  [] Easy bruising  [] Easy bleeding   [] Hypercoagulable state   [] Anemic Gastrointestinal:  [] Diarrhea   [] Vomiting  [] Gastroesophageal reflux/heartburn   [] Difficulty swallowing. [] Abdominal pain Genitourinary:  [] Chronic kidney disease   [] Difficult urination  [] Anuric   [] Blood in urine [] Frequent urination  [] Burning with urination   [] Hematuria Skin:  [] Rashes   [] Ulcers [] Wounds Psychological:  [] History of anxiety   []  History of major depression  []  Memory Difficulties      OBJECTIVE:   Physical Exam  BP (!) 191/93 (BP Location: Right Arm)   Pulse 67   Resp 16   Wt 176 lb (79.8 kg)   BMI 27.57 kg/m   Gen: WD/WN, NAD Head: [place]/AT, No temporalis wasting.  Ear/Nose/Throat: Hearing grossly intact, nares w/o erythema or drainage Eyes: PER, EOMI, sclera nonicteric.  Neck: Supple, no masses.  No JVD.  Pulmonary:  Good air movement, no use of accessory muscles.  Cardiac: RRR Vascular:  Vessel Right Left  Radial Palpable Palpable  Dorsalis Pedis Palpable Palpable  Posterior Tibial Palpable Palpable   Gastrointestinal: soft, non-distended. No guarding/no peritoneal signs.  Musculoskeletal: M/S 5/5 throughout.  No deformity or atrophy.  Neurologic: Pain and light touch intact in extremities.  Symmetrical.  Speech is fluent. Motor exam as listed above. Psychiatric: Judgment intact, Mood & affect appropriate for pt's clinical situation. Dermatologic: No Venous rashes. No Ulcers Noted.  No changes consistent with cellulitis. Lymph : No Cervical lymphadenopathy, no lichenification or skin  changes of chronic lymphedema.       ASSESSMENT AND PLAN:  1. AAA (abdominal aortic aneurysm) without rupture (HCC) Recommend: Patient is status post successful endovascular repair of the AAA.   No further intervention is required at this time.   No endoleak is detected and the aneurysm sac is stable.  The patient will continue antiplatelet therapy as prescribed as well as aggressive management of hyperlipidemia. Exercise is again strongly encouraged.   However, endografts require continued surveillance with ultrasound or CT scan. This is mandatory to detect any changes that allow repressurization of the  aneurysm sac.  The patient is informed that this would be asymptomatic.  The patient is reminded that lifelong routine surveillance is a necessity with an endograft. Patient will continue to follow-up at 12 month intervals with ultrasound of the aorta. - VAS [place] EVAR DUPLEX; Future  2. Hyperlipidemia, unspecified hyperlipidemia type Continue statin as ordered and reviewed, no changes at this time   3. Smoker Smoking cessation was discussed, 3-10 minutes spent on this topic specifically     Current Outpatient Medications on File Prior to Visit  Medication Sig Dispense Refill  . acetaminophen (TYLENOL) 500 MG tablet Take 500 mg by mouth every 6 (six) hours as needed.     Marland Kitchen. aspirin EC 81 MG tablet Take 81 mg by mouth.    Marland Kitchen. augmented betamethasone dipropionate (DIPROLENE-AF) 0.05 % ointment APPLY TOPICAL 2(TWO) TIMES A DAY  0  . dorzolamide-timolol (COSOPT) 22.3-6.8 MG/ML ophthalmic solution Place 1 drop into both eyes 2 (two) times daily.     Marland Kitchen. latanoprost (XALATAN) 0.005 % ophthalmic solution Place 1 drop into both eyes at bedtime.  3  . lovastatin (MEVACOR) 20 MG tablet Take 1 tablet by mouth every morning.      No current facility-administered medications on file prior to visit.     There are no Patient Instructions on file for this visit. No follow-ups on file.   Georgiana SpinnerFallon E  Brown, NP  This note was completed with Office managerDragon Dictation.  Any errors are purely unintentional.

## 2019-03-23 IMAGING — CT CT CTA ABD/PEL W/CM AND/OR W/O CM
2 of 9 series · 8 of 46 positions shown, 14 images · IV contrast (iopamidol)
Comparison: Aortic ultrasound-05/16/2017; CT abdomen and
pelvis-04/02/2012

CLINICAL DATA: Evaluate size of abdominal aortic aneurysm for
operative planning.

EXAM:
CTA ABDOMEN AND PELVIS WITH CONTRAST
TECHNIQUE: Multidetector CT imaging of the abdomen and pelvis was performed
using the standard protocol during bolus administration of
intravenous contrast. Multiplanar reconstructed images and MIPs were
obtained and reviewed to evaluate the vascular anatomy.
CONTRAST:  100mL TXEWKS-D9K IOPAMIDOL (TXEWKS-D9K) INJECTION 76%

[Series 8: cor art st cta arterial · coronal · arterial · 0.72mm/px · 2 of 142 slices shown, 3 images]
[im 48/142  soft-tissue]
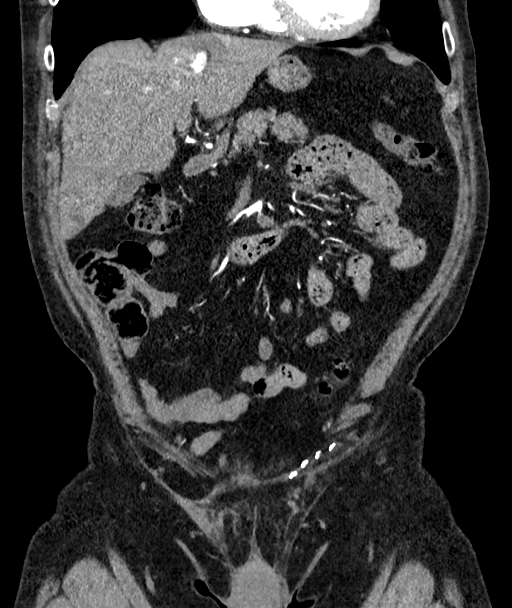
[im 48/142  bone]
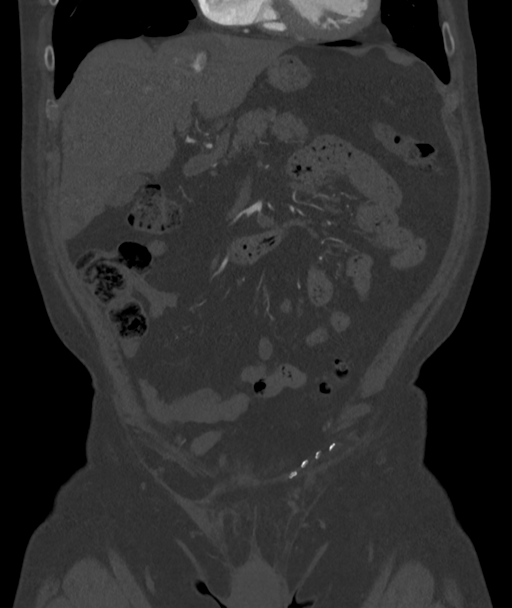
[im 95/142  soft-tissue]
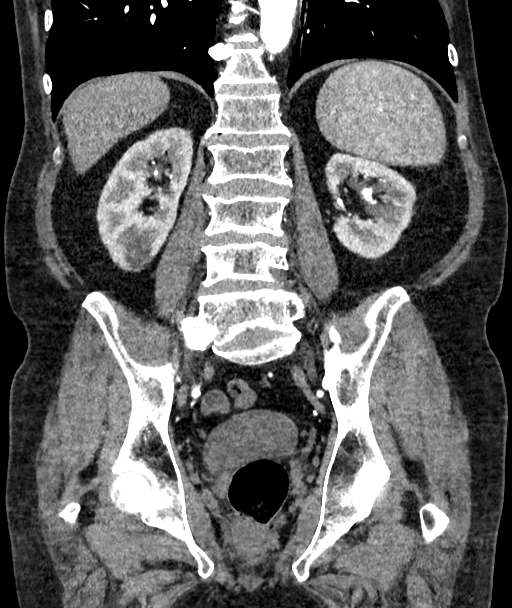

[Series 16: axial st cta venous · axial · portal-venous · 0.72mm/px · z∈[-1571,-1256]mm · 6 of 89 slices shown, 11 images]
[im 13/89  soft-tissue]
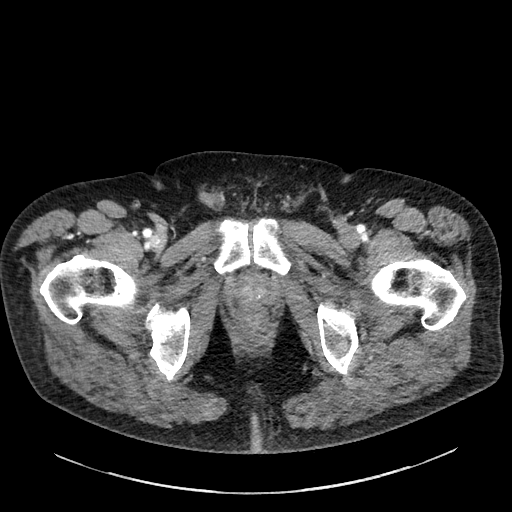
[im 13/89  bone]
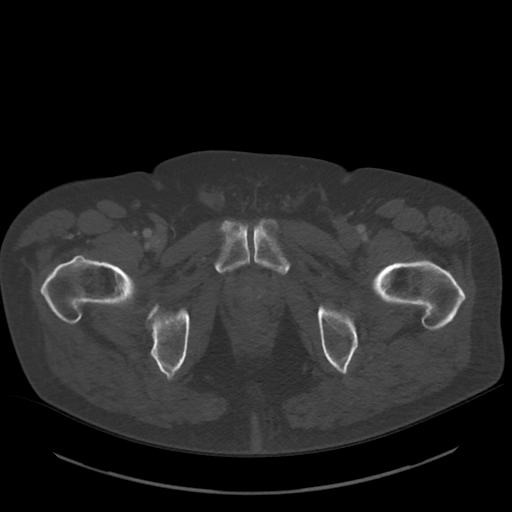
[im 26/89  soft-tissue]
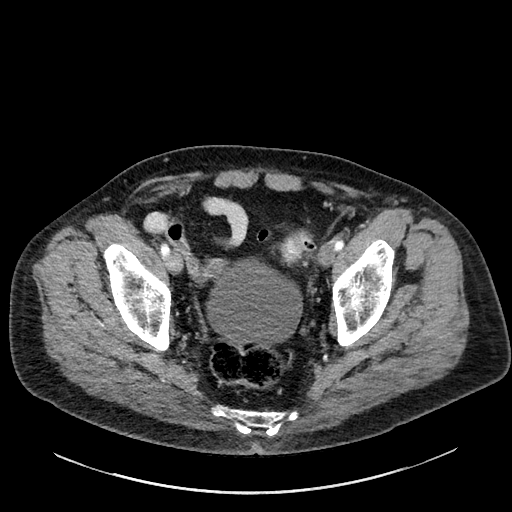
[im 38/89  soft-tissue]
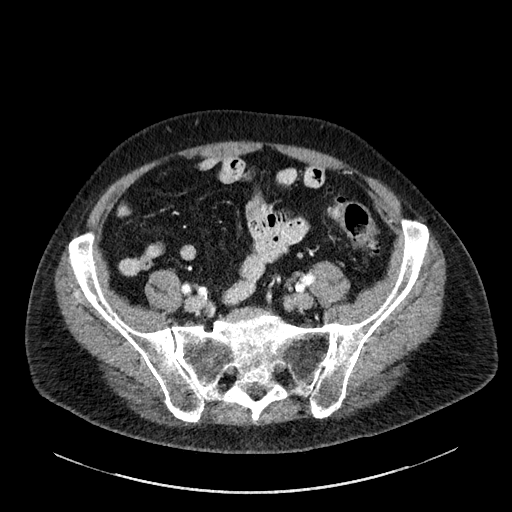
[im 38/89  lung]
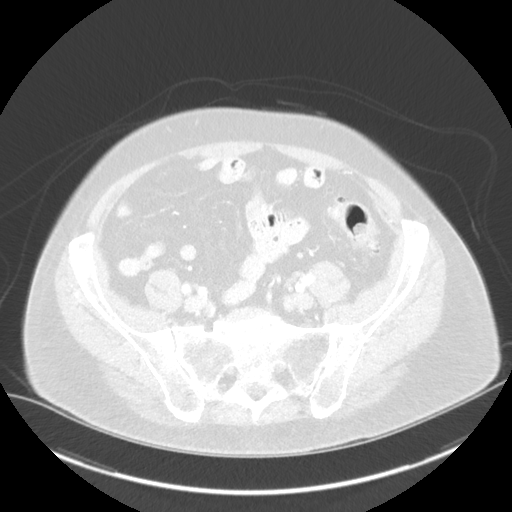
[im 51/89  soft-tissue]
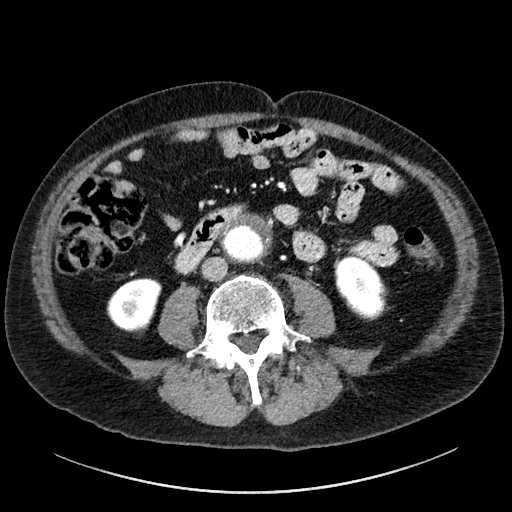
[im 51/89  lung]
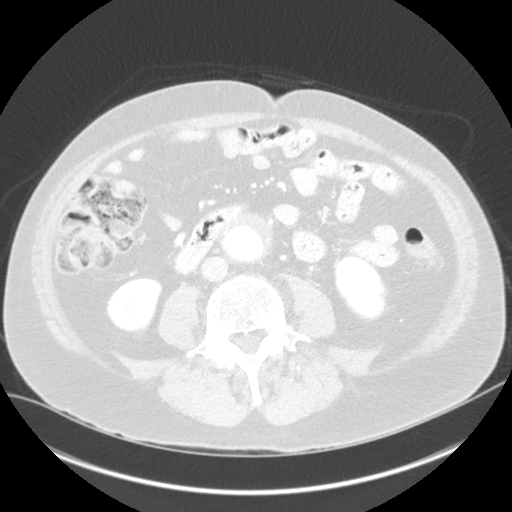
[im 63/89  soft-tissue]
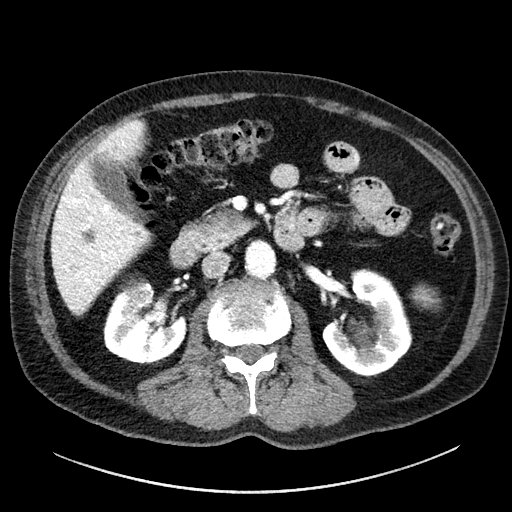
[im 63/89  lung]
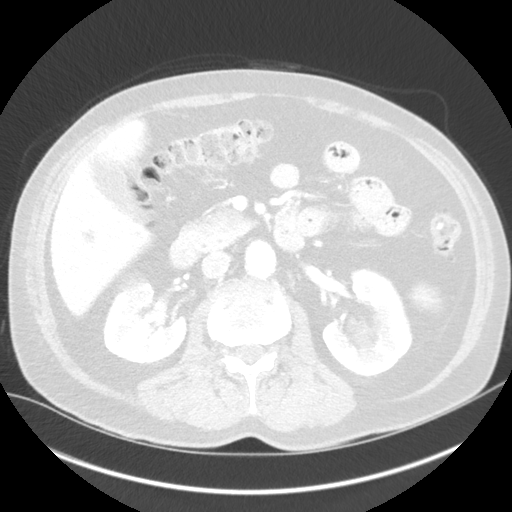
[im 76/89  soft-tissue]
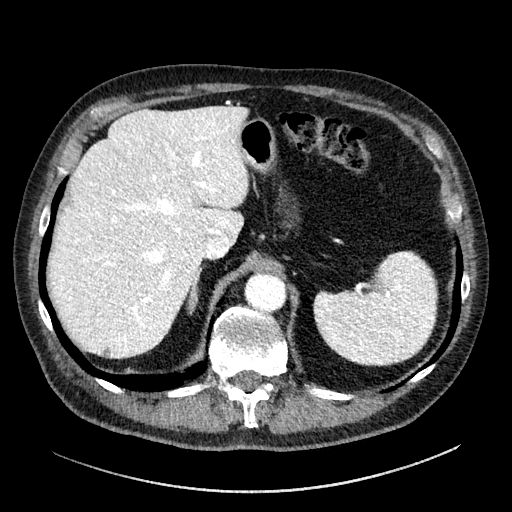
[im 76/89  lung]
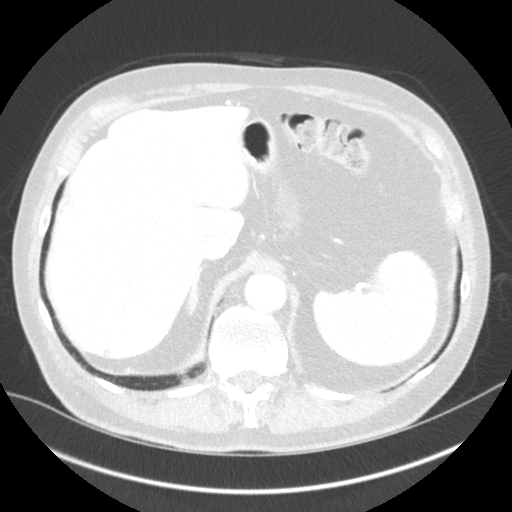

[8 of 46 positions shown; findings below may reference images not displayed]

FINDINGS: VASCULAR

Aorta: Moderate amount of mixed calcified and noncalcified
atherosclerotic plaque throughout the abdominal aorta, not resulting
in a hemodynamically significant stenosis.

Infrarenal abdominal aortic aneurysm measuring approximately 4.7 x
5.2 x 4.6 cm as measured in greatest oblique short axis axial (image
85, series 5), coronal (image 65, series 8) and sagittal (image 90,
series 10) dimensions respectively. The aneurysm originates
approximately 3.4 cm caudal to the take-off of the most inferior
left renal artery and extends to the level of the aortic bifurcation
without extension to involve either of the common iliac arteries.
There is a minimal amount of crescentic noncalcified thrombus within
the dominant component of the aneurysm. No definitive periaortic
stranding.

Celiac: There is a minimal amount of calcified atherosclerotic
plaque involving the origin of the celiac artery, not resulting in a
hemodynamically significant stenosis. The distal aspect of the main
trunk of the celiac artery appears mildly ectatic measuring 1.2 cm
in diameter (image 41, series 5). Conventional branching pattern.

SMA: There is a minimal amount of mixed calcified and noncalcified
atherosclerotic plaque involving the origin the SMA, not resulting
in hemodynamically significant stenosis. Conventional branching
pattern. The distal tributaries of the SMA appear widely patent
without discrete intraluminal filling defect to suggest distal
embolism.

Renals: There is a minimal amount of mixed calcified and
noncalcified atherosclerotic plaque involving the origin of the
bilateral renal arteries, not resulting in a hemodynamically
significant stenosis. There is minimal beaded irregularity involving
the distal aspect of the left renal artery (coronal image 83, series
8), as could be seen in the setting mild FMD.

IMA: Remains patent.

Inflow: There is mixed calcified and noncalcified atherosclerotic
plaque within the bilateral normal caliber common iliac arteries,
not resulting in hemodynamically significant stenosis. The bilateral
internal iliac arteries are diseased though patent and of normal
caliber. The bilateral external iliac arteries are of normal caliber
and widely patent without hemodynamically significant stenosis.

Proximal Outflow: There is a minimal amount of predominantly
calcified atherosclerotic plaque within the bilateral common femoral
arteries, not definitely resulting in hemodynamically significant
stenosis. The imaged portions of the bilateral superficial and deep
femoral arteries appear widely patent throughout their imaged
course.

Veins: The IVC and pelvic venous system appear widely patent.

Review of the MIP images confirms the above findings.

NON-VASCULAR

Lower chest: Limited visualization of lower thorax demonstrates
moderate severe centrilobular emphysematous change. Minimal
subsegmental atelectasis within the imaged caudal aspect the right
middle lobe and lingula. No focal airspace opacities. No pleural
effusion.

Borderline cardiomegaly.  No pericardial effusion.

Hepatobiliary: Normal hepatic contour. Note is made of an
approximately 1.7 cm hypoattenuating cyst within the right lobe of
the liver (image 25, series 6). Additional subcentimeter
hypoattenuating hepatic lesions are too small to adequately
characterize of favored to represent additional hepatic cysts.
Normal appearance of the gallbladder given degree distention. No
radiopaque gallstones. No intra extrahepatic biliary duct
dilatation. No ascites.

Pancreas: Normal appearance of the pancreas

Spleen: Normal appearance of the spleen

Adrenals/Urinary Tract: There is symmetric enhancement of the
bilateral kidneys. Bilateral nonobstructing nephrolithiasis with
index nonobstructing right-sided renal stone measuring 0.8 cm in
greatest short axis diameter (coronal image 98, series 8 and index
left-sided nonobstructing stone measuring 1.3 cm (image 96, series
8). Note is made of nonenhancing right-sided renal cysts with index
cyst arising from the anterior superior pole the right kidney
measuring approximately 3.3 cm in diameter. Additional subcentimeter
bilateral hypoattenuating renal lesions too small to adequately
characterize of favored to represent additional renal cysts. No
urine obstruction or perinephric stranding. Normal appearance the
bilateral adrenal glands. Normal appearance of the urinary bladder
given degree of distention.

Stomach/Bowel: Large hiatal hernia. Moderate to large colonic stool
burden without evidence of enteric obstruction. Colonic
diverticulosis, most severely affecting the sigmoid colon within the
left lower abdomen/pelvis without superimposed acute diverticulitis.
Normal appearance of the terminal ileum and retrocecal appendix. No
pneumoperitoneum, pneumatosis or portal venous gas.

Lymphatic: No bulky retroperitoneal, mesenteric, pelvic or inguinal
lymphadenopathy.

Reproductive: Dystrophic calcifications within normal sized prostate
gland. No free fluid in the pelvic cul-de-sac.

Other: Post left-sided inguinal hernia repair without evidence of
recurrence. Regional soft tissues appear otherwise normal.

Musculoskeletal: No acute or aggressive osseous abnormalities. Note
is made of a left L5 pars defect (image 101, series 10) without
associated anterolisthesis.
IMPRESSION: VASCULAR

1. Approximately 5.2 cm infrarenal abdominal aortic aneurysm with
minimal amount of associated crescentic mural thrombus. Aortic
aneurysm NOS (NFH2C-944.X). No dissection or periaortic stranding.
2.  Aortic Atherosclerosis (NFH2C-XIP.P).
3. Very mild beaded irregularity involving the distal aspect of the
left renal artery as could be seen in the setting of mild FMD.

NON-VASCULAR

1. Bilateral nonobstructing nephrolithiasis.
2.  Emphysema (NFH2C-TJ4.N).
3. Rather extensive colonic diverticulosis without evidence of
superimposed acute diverticulitis.

## 2019-07-03 IMAGING — US US AORTA/IVC/ILIACS DOPPLER LTD
1 series · 14 of 25 positions shown · non-contrast
Comparison: Ultrasound 05/08/2013 and CT 04/02/2012

CLINICAL DATA: Follow-up abdominal aortic aneurysm.

EXAM:
ULTRASOUND OF ABDOMINAL AORTA
TECHNIQUE: Ultrasound examination of the abdominal aorta was performed to
evaluate for abdominal aortic aneurysm.

[Series 1: us aorta/ivc/iliacs doppler ltd · 0.25mm/px · 14 of 30 slices shown]
[im 1/30]
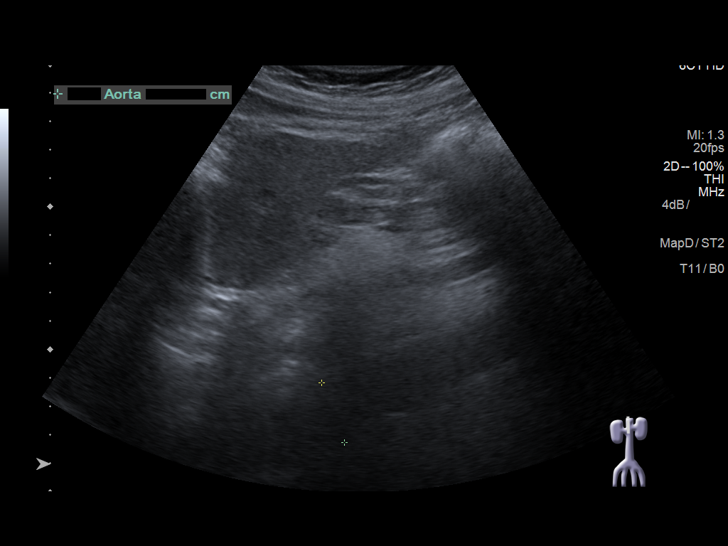
[im 3/30]
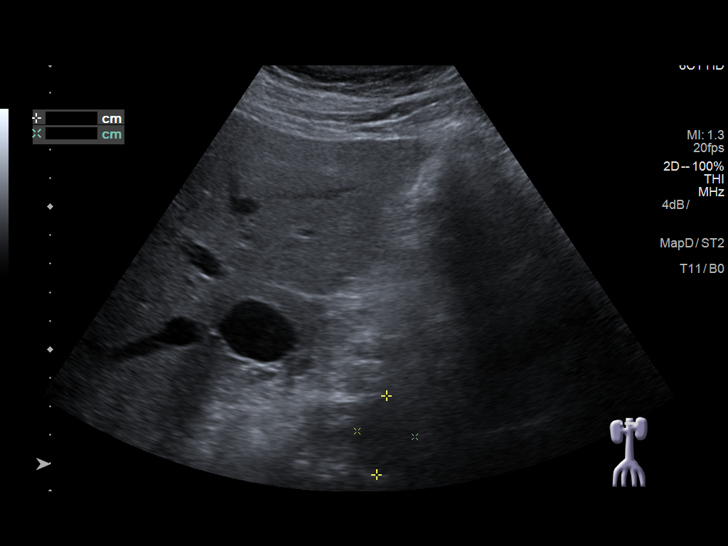
[im 5/30]
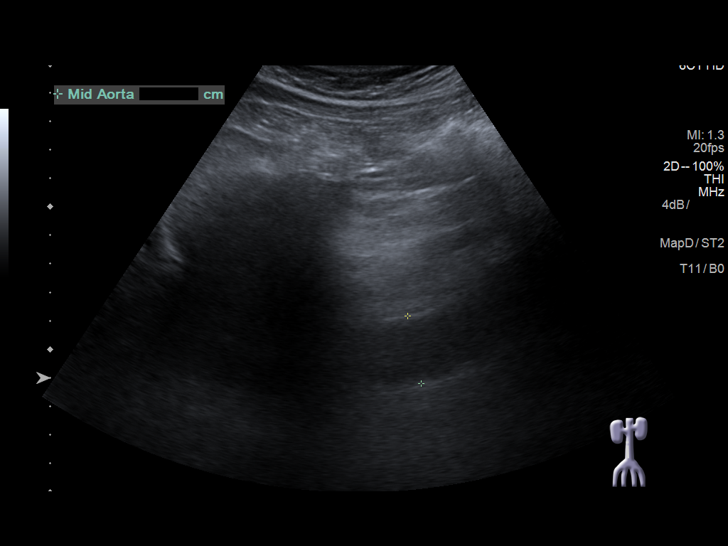
[im 8/30]
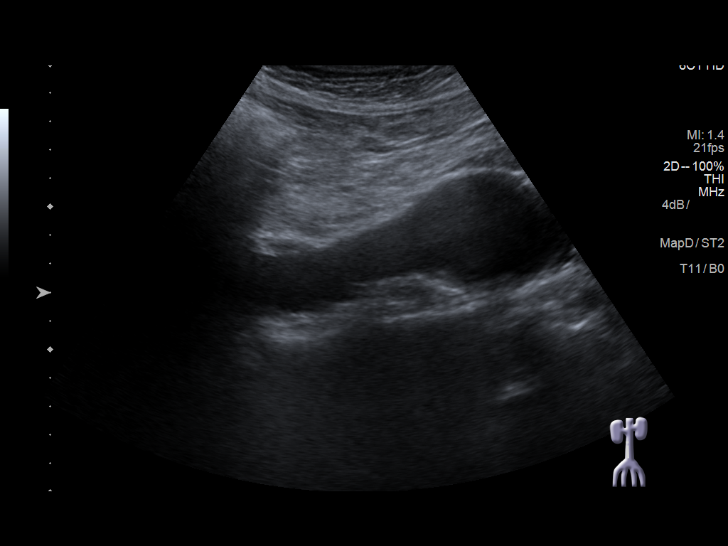
[im 10/30]
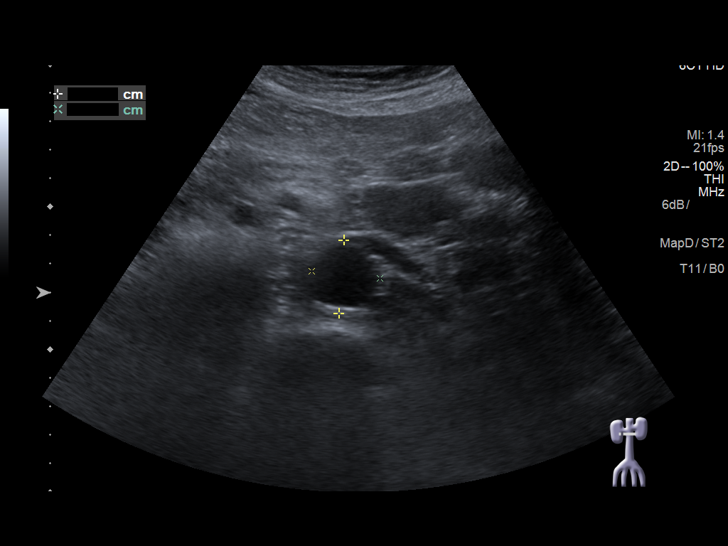
[im 11/30]
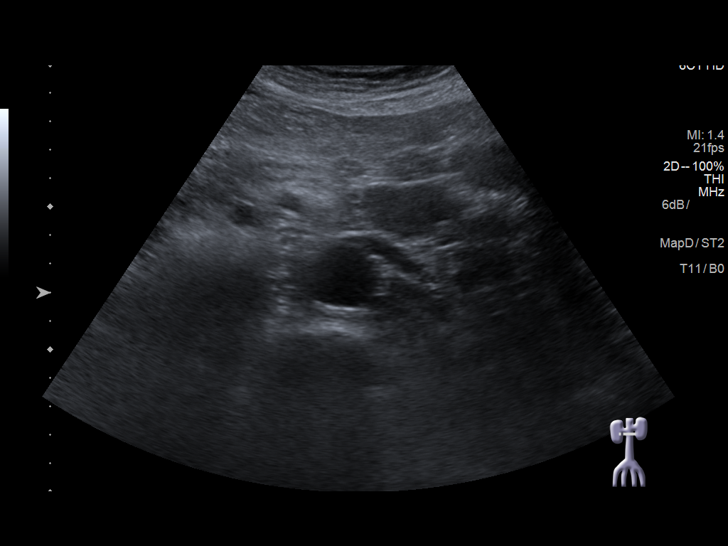
[im 14/30]
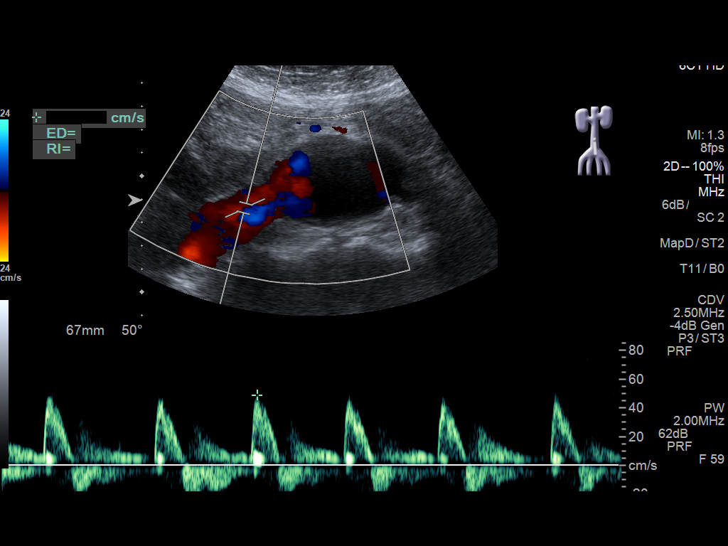
[im 16/30]
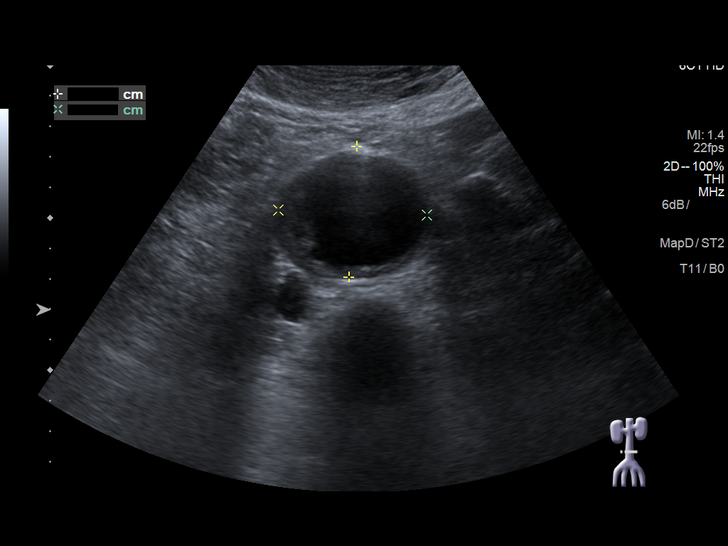
[im 19/30]
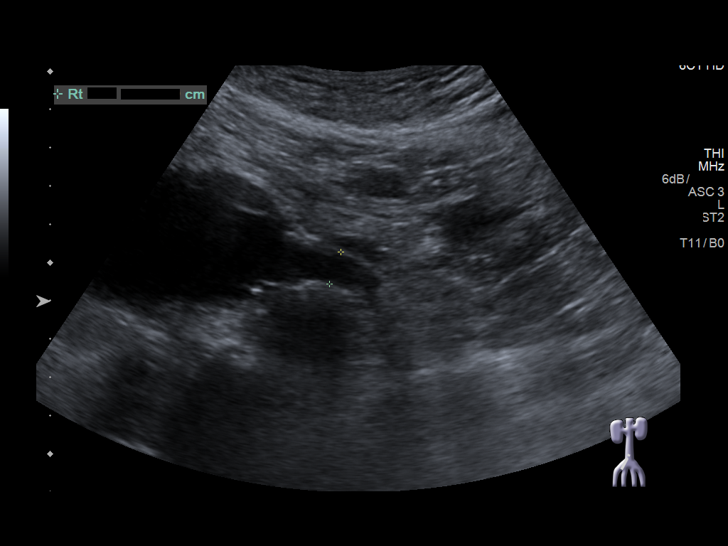
[im 20/30]
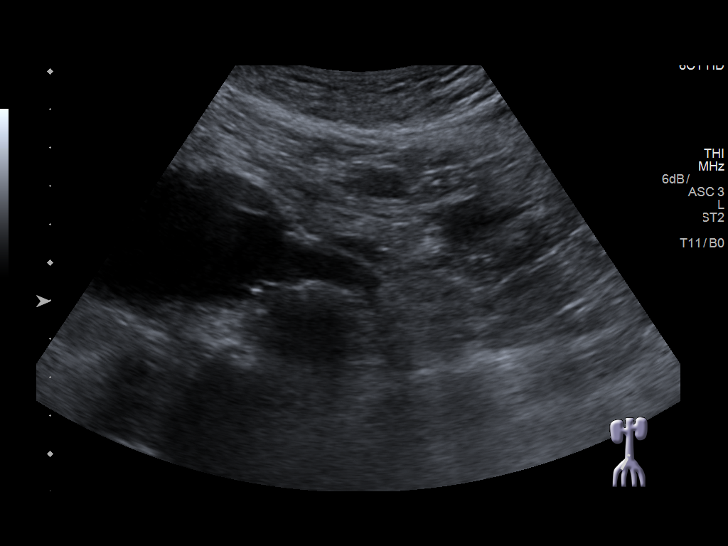
[im 22/30]
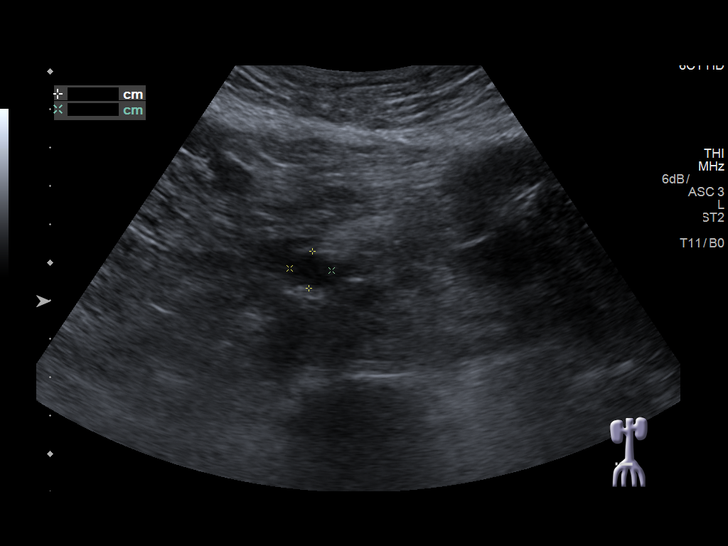
[im 25/30]
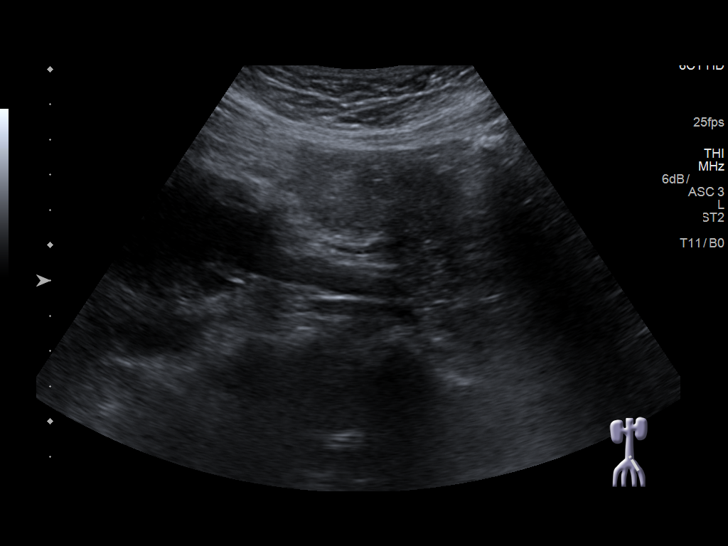
[im 27/30]
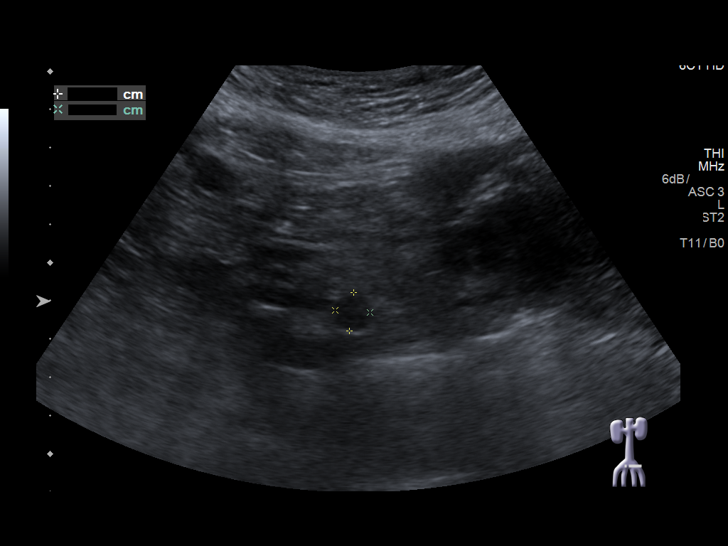
[im 30/30]
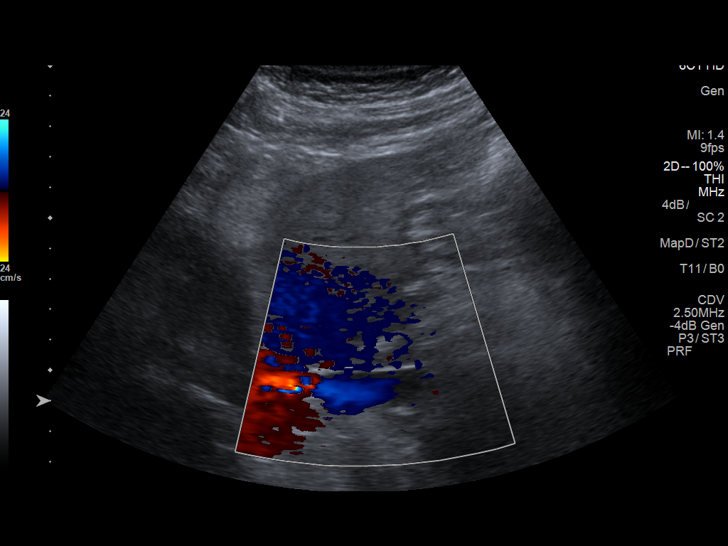

[14 of 25 positions shown; findings below may reference images not displayed]

FINDINGS: Abdominal aortic measurements as follows:

Proximal:  2.8 cm

Mid:  2.6 cm

Distal: 4.9 cm

Distal abdominal aortic aneurysm measures 4.9 x 4.3 cm on the
transverse images and previously measured 4.1 x 3.6 cm. Small amount
of mural thrombus within the aortic aneurysm particularly along the
right side. The abdominal aorta is patent. Right common iliac artery
measures up to 1.1 cm and patent. Left common iliac artery measures
up to 1.0 cm and patent. IVC is patent.
IMPRESSION: Enlargement of the abdominal aortic aneurysm, measuring up to
cm. Recommend followup by abdomen and pelvis CTA in 6 months, and
vascular surgery referral/consultation if not already obtained. This
recommendation follows ACR consensus guidelines: White Paper of the
ACR Incidental Findings Committee II on Vascular Findings. [HOSPITAL] 1331; [DATE].

## 2019-07-13 ENCOUNTER — Other Ambulatory Visit: Payer: Self-pay

## 2019-07-13 ENCOUNTER — Ambulatory Visit (INDEPENDENT_AMBULATORY_CARE_PROVIDER_SITE_OTHER): Payer: Medicare HMO

## 2019-07-13 ENCOUNTER — Encounter (INDEPENDENT_AMBULATORY_CARE_PROVIDER_SITE_OTHER): Payer: Self-pay | Admitting: Vascular Surgery

## 2019-07-13 ENCOUNTER — Ambulatory Visit (INDEPENDENT_AMBULATORY_CARE_PROVIDER_SITE_OTHER): Payer: Medicare HMO | Admitting: Vascular Surgery

## 2019-07-13 VITALS — BP 169/83 | HR 55 | Ht 66.0 in | Wt 171.0 lb

## 2019-07-13 DIAGNOSIS — E785 Hyperlipidemia, unspecified: Secondary | ICD-10-CM

## 2019-07-13 DIAGNOSIS — I714 Abdominal aortic aneurysm, without rupture, unspecified: Secondary | ICD-10-CM

## 2019-07-13 DIAGNOSIS — J439 Emphysema, unspecified: Secondary | ICD-10-CM | POA: Diagnosis not present

## 2019-07-13 NOTE — Progress Notes (Signed)
MRN : [id number]  Jason Reese is a 79 y.o. ([id number]) male who presents with chief complaint of  Chief Complaint  Patient presents with  . Follow-up    U/S Follow up  .  History of Present Illness:  The patient returns to the office for surveillance of an abdominal aortic aneurysm status post stent graft placement on 07/24/2017.   Patient denies abdominal pain or back pain, no other abdominal complaints. No groin related complaints. No symptoms consistent with distal embolization No changes in claudication distance.   There have been no interval changes in his overall healthcare since his last visit.   Patient denies amaurosis fugax or TIA symptoms. There is no history of claudication or rest pain symptoms of the lower extremities. The patient denies angina or shortness of breath.   Duplex US of the aorta and iliac arteries shows a 2.3 AAA sac with no endoleak, no change in the sac diameter compared to the previous study.  Current Meds  Medication Sig  . acetaminophen (TYLENOL) 500 MG tablet Take 500 mg by mouth every 6 (six) hours as needed.   Marland Kitchen aspirin EC 81 MG tablet Take 81 mg by mouth.  . latanoprost (XALATAN) 0.005 % ophthalmic solution Place 1 drop into both eyes at bedtime.  . lovastatin (MEVACOR) 20 MG tablet Take 1 tablet by mouth every morning.     Past Medical History:  Diagnosis Date  . Aneurysm (HCC)   . Cataract   . Glaucoma   . Hard of hearing   . History of kidney stones   . Hyperlipidemia   . PONV (postoperative nausea and vomiting)     Past Surgical History:  Procedure Laterality Date  . ENDOVASCULAR REPAIR/STENT GRAFT N/A 07/24/2017   Procedure: ENDOVASCULAR REPAIR/STENT GRAFT;  Surgeon: Annice Needy, MD;  Location: [facility];  Service: Cardiovascular;  Laterality: N/A;  . EYE SURGERY  2013  . INGUINAL HERNIA REPAIR Right 08/25/2014   Procedure: RIGHT INGUINAL HERNIA REPAIR WITH MESH ;  Surgeon: Ida Rogue, MD;   Location: [facility] ORS;  Service: General;  Laterality: Right;  . INGUINAL HERNIA REPAIR Left 1980   x 2- Dr. Alan Mulder  . INGUINAL HERNIA REPAIR Right   . JOINT REPLACEMENT Left 2007   shoulder  . SHOULDER ARTHROSCOPY Left     Social History Social History   Tobacco Use  . Smoking status: Former Smoker    Quit date: 07/28/2005    Years since quitting: 13.9  . Smokeless tobacco: Never Used  Vaping Use  . Vaping Use: Never used  Substance Use Topics  . Alcohol use: No  . Drug use: No    Family History Family History  Problem Relation Age of Onset  . Diabetes Mother     No Known Allergies   REVIEW OF SYSTEMS (Negative unless checked)  Constitutional: [] Weight loss  [] Fever  [] Chills Cardiac: [] Chest pain   [] Chest pressure   [] Palpitations   [] Shortness of breath when laying flat   [] Shortness of breath with exertion. Vascular:  [] Pain in legs with walking   [] Pain in legs at rest  [] History of DVT   [] Phlebitis   [] Swelling in legs   [] Varicose veins   [] Non-healing ulcers Pulmonary:   [] Uses home oxygen   [] Productive cough   [] Hemoptysis   [] Wheeze  [x] COPD   [] Asthma Neurologic:  [] Dizziness   [] Seizures   [] History of stroke   [] History of TIA  [] Aphasia   [] Vissual changes   []   Weakness or numbness in arm   [] Weakness or numbness in leg Musculoskeletal:   [] Joint swelling   [] Joint pain   [] Low back pain Hematologic:  [] Easy bruising  [] Easy bleeding   [] Hypercoagulable state   [] Anemic Gastrointestinal:  [] Diarrhea   [] Vomiting  [] Gastroesophageal reflux/heartburn   [] Difficulty swallowing. Genitourinary:  [] Chronic kidney disease   [] Difficult urination  [] Frequent urination   [] Blood in urine Skin:  [] Rashes   [] Ulcers  Psychological:  [] History of anxiety   []  History of major depression.  Physical Examination  Vitals:   07/13/19 0837  BP: (!) 169/83  Pulse: (!) 55  Weight: 171 lb (77.6 kg)  Height: 5\' 6"  (1.676 m)   Body mass index is 27.6 kg/m. Gen: WD/WN,  NAD Head: [place]/AT, No temporalis wasting.  Ear/Nose/Throat: Hearing grossly intact, nares w/o erythema or drainage Eyes: PER, EOMI, sclera nonicteric.  Neck: Supple, no large masses.   Pulmonary:  Good air movement, no audible wheezing bilaterally, no use of accessory muscles.  Cardiac: RRR, no JVD Vascular:  Vessel Right Left  Radial Palpable Palpable  Gastrointestinal: Non-distended. No guarding/no peritoneal signs.  Musculoskeletal: M/S 5/5 throughout.  No deformity or atrophy.  Neurologic: CN 2-12 intact. Symmetrical.  Speech is fluent. Motor exam as listed above. Psychiatric: Judgment intact, Mood & affect appropriate for pt's clinical situation. Dermatologic: No rashes or ulcers noted.  No changes consistent with cellulitis.  CBC Lab Results  Component Value Date   WBC 13.9 (H) 07/25/2017   HGB 13.1 07/25/2017   HCT 40.0 07/25/2017   MCV 80.9 07/25/2017   PLT 170 07/25/2017    BMET    Component Value Date/Time   NA 139 07/25/2017 0551   K 3.8 07/25/2017 0551   CL 109 07/25/2017 0551   CO2 22 07/25/2017 0551   GLUCOSE 97 07/25/2017 0551   BUN 11 07/25/2017 0551   CREATININE 0.62 07/25/2017 0551   CREATININE 0.98 04/02/2012 0848   CALCIUM 9.0 07/25/2017 0551   GFRNONAA >60 07/25/2017 0551   GFRNONAA >60 04/02/2012 0848   GFRAA >60 07/25/2017 0551   GFRAA >60 04/02/2012 0848   CrCl cannot be calculated (Patient's most recent lab result is older than the maximum 21 days allowed.).  COAG Lab Results  Component Value Date   INR 0.93 07/18/2017    Radiology No results found.   Assessment/Plan 1. AAA (abdominal aortic aneurysm) without rupture (HCC) Recommend: Patient is status post successful endovascular repair of the AAA.   No further intervention is required at this time.   No endoleak is detected and the aneurysm sac is stable.  The patient will continue antiplatelet therapy as prescribed as well as aggressive management of hyperlipidemia. Exercise is  again strongly encouraged.   However, endografts require continued surveillance with ultrasound or CT scan. This is mandatory to detect any changes that allow repressurization of the aneurysm sac.  The patient is informed that this would be asymptomatic.  The patient is reminded that lifelong routine surveillance is a necessity with an endograft. Patient will continue to follow-up at 12 month intervals with ultrasound of the  - VAS US AORTA/IVC/ILIACS; Future  2. Hyperlipidemia, unspecified hyperlipidemia type Continue statin as ordered and reviewed, no changes at this time   3. Pulmonary emphysema, unspecified emphysema type ([place]) Continue pulmonary medications and aerosols as already ordered, these medications have been reviewed and there are no changes at this time.     Hortencia Pilar, MD  07/13/2019 8:50 AM

## 2019-07-14 ENCOUNTER — Encounter (INDEPENDENT_AMBULATORY_CARE_PROVIDER_SITE_OTHER): Payer: Self-pay | Admitting: Vascular Surgery

## 2020-05-09 ENCOUNTER — Encounter: Payer: Self-pay | Admitting: Ophthalmology

## 2020-05-09 ENCOUNTER — Other Ambulatory Visit: Payer: Self-pay

## 2020-05-12 NOTE — Discharge Instructions (Signed)

## 2020-05-16 ENCOUNTER — Ambulatory Visit
Admission: RE | Admit: 2020-05-16 | Discharge: 2020-05-16 | Disposition: A | Discharge status: Home / Self Care | Payer: Medicare HMO | Attending: Ophthalmology | Admitting: Ophthalmology

## 2020-05-16 ENCOUNTER — Ambulatory Visit: Payer: Medicare HMO | Admitting: Anesthesiology

## 2020-05-16 ENCOUNTER — Other Ambulatory Visit: Payer: Self-pay

## 2020-05-16 ENCOUNTER — Encounter: Payer: Self-pay | Admitting: Ophthalmology

## 2020-05-16 ENCOUNTER — Encounter
Admission: RE | Disposition: A | Discharge status: Home / Self Care | Payer: Self-pay | Source: Home / Self Care | Attending: Ophthalmology

## 2020-05-16 DIAGNOSIS — I729 Aneurysm of unspecified site: Secondary | ICD-10-CM | POA: Diagnosis not present

## 2020-05-16 DIAGNOSIS — H9190 Unspecified hearing loss, unspecified ear: Secondary | ICD-10-CM | POA: Insufficient documentation

## 2020-05-16 DIAGNOSIS — Z87891 Personal history of nicotine dependence: Secondary | ICD-10-CM | POA: Insufficient documentation

## 2020-05-16 DIAGNOSIS — H409 Unspecified glaucoma: Secondary | ICD-10-CM | POA: Diagnosis not present

## 2020-05-16 DIAGNOSIS — Z7982 Long term (current) use of aspirin: Secondary | ICD-10-CM | POA: Insufficient documentation

## 2020-05-16 DIAGNOSIS — Z79899 Other long term (current) drug therapy: Secondary | ICD-10-CM | POA: Insufficient documentation

## 2020-05-16 DIAGNOSIS — Z96612 Presence of left artificial shoulder joint: Secondary | ICD-10-CM | POA: Insufficient documentation

## 2020-05-16 DIAGNOSIS — H2511 Age-related nuclear cataract, right eye: Secondary | ICD-10-CM | POA: Diagnosis not present

## 2020-05-16 DIAGNOSIS — E785 Hyperlipidemia, unspecified: Secondary | ICD-10-CM | POA: Insufficient documentation

## 2020-05-16 HISTORY — PX: CATARACT EXTRACTION W/PHACO: SHX586

## 2020-05-16 HISTORY — DX: Presence of external hearing-aid: Z97.4

## 2020-05-16 SURGERY — PHACOEMULSIFICATION, CATARACT, WITH IOL INSERTION
Anesthesia: Monitor Anesthesia Care | Site: Eye | Laterality: Right

## 2020-05-16 MED ORDER — SODIUM HYALURONATE 23 MG/ML IO SOLN
INTRAOCULAR | Status: DC | PRN
  Administered 2020-05-16: 0.6 mL via INTRAOCULAR

## 2020-05-16 MED ORDER — OXYCODONE HCL 5 MG/5ML PO SOLN: 5.0000 mg | Freq: Once | ORAL | Status: DC | PRN

## 2020-05-16 MED ORDER — MOXIFLOXACIN HCL 0.5 % OP SOLN
OPHTHALMIC | Status: DC | PRN
  Administered 2020-05-16: 0.2 mL via OPHTHALMIC

## 2020-05-16 MED ORDER — OXYCODONE HCL 5 MG PO TABS: 5.0000 mg | ORAL_TABLET | Freq: Once | ORAL | Status: DC | PRN

## 2020-05-16 MED ORDER — ARMC OPHTHALMIC DILATING DROPS
1.0000 "application " | OPHTHALMIC | Status: DC | PRN
  Administered 2020-05-16 (×3): 1 via OPHTHALMIC

## 2020-05-16 MED ORDER — SODIUM HYALURONATE 10 MG/ML IO SOLN
INTRAOCULAR | Status: DC | PRN
  Administered 2020-05-16: 0.55 mL via INTRAOCULAR

## 2020-05-16 MED ORDER — FENTANYL CITRATE (PF) 100 MCG/2ML IJ SOLN
INTRAMUSCULAR | Status: DC | PRN
  Administered 2020-05-16: 50 ug via INTRAVENOUS

## 2020-05-16 MED ORDER — LACTATED RINGERS IV SOLN: INTRAVENOUS | Status: DC

## 2020-05-16 MED ORDER — EPINEPHRINE PF 1 MG/ML IJ SOLN
INTRAOCULAR | Status: DC | PRN
  Administered 2020-05-16: 84 mL via OPHTHALMIC

## 2020-05-16 MED ORDER — LIDOCAINE HCL (PF) 2 % IJ SOLN
INTRAOCULAR | Status: DC | PRN
  Administered 2020-05-16: 1 mL via INTRAOCULAR

## 2020-05-16 MED ORDER — TETRACAINE HCL 0.5 % OP SOLN
1.0000 [drp] | OPHTHALMIC | Status: DC | PRN
  Administered 2020-05-16 (×3): 1 [drp] via OPHTHALMIC

## 2020-05-16 MED ORDER — MIDAZOLAM HCL 2 MG/2ML IJ SOLN
INTRAMUSCULAR | Status: DC | PRN
  Administered 2020-05-16: 1 mg via INTRAVENOUS

## 2020-05-16 SURGICAL SUPPLY — 17 items
CANNULA ANT/CHMB 27GA (MISCELLANEOUS) ×4 IMPLANT
DISSECTOR HYDRO NUCLEUS 50X22 (MISCELLANEOUS) ×2 IMPLANT
GLOVE PI ULTRA LF STRL 7.5 (GLOVE) ×1 IMPLANT
GLOVE PI ULTRA NON LATEX 7.5 (GLOVE) ×1
GLOVE SURG SYN 8.5  E (GLOVE) ×1
GLOVE SURG SYN 8.5 E (GLOVE) ×1 IMPLANT
GOWN STRL REUS W/ TWL LRG LVL3 (GOWN DISPOSABLE) ×2 IMPLANT
GOWN STRL REUS W/TWL LRG LVL3 (GOWN DISPOSABLE) ×4
LENS IOL TECNIS EYHANCE 22.5 (Intraocular Lens) ×2 IMPLANT
MARKER SKIN DUAL TIP RULER LAB (MISCELLANEOUS) ×2 IMPLANT
PACK DR. KING ARMS (PACKS) ×2 IMPLANT
PACK EYE AFTER SURG (MISCELLANEOUS) ×2 IMPLANT
PACK OPTHALMIC (MISCELLANEOUS) ×2 IMPLANT
SYR 3ML LL SCALE MARK (SYRINGE) ×2 IMPLANT
SYR TB 1ML LUER SLIP (SYRINGE) ×2 IMPLANT
WATER STERILE IRR 250ML POUR (IV SOLUTION) ×2 IMPLANT
WIPE NON LINTING 3.25X3.25 (MISCELLANEOUS) ×2 IMPLANT

## 2020-05-16 NOTE — H&P (Signed)
[facility]   Primary Care Physician:  Jaclyn Shaggy, MD Ophthalmologist: Dr. Willey Blade  Pre-Procedure History & Physical: HPI:  Jason Reese is a 80 y.o. male here for cataract surgery.   Past Medical History:  Diagnosis Date  . Aneurysm (HCC)   . Cataract   . Glaucoma   . Hard of hearing   . History of kidney stones   . Hyperlipidemia   . PONV (postoperative nausea and vomiting)   . Wears hearing aid in both ears     Past Surgical History:  Procedure Laterality Date  . ENDOVASCULAR REPAIR/STENT GRAFT N/A 07/24/2017   Procedure: ENDOVASCULAR REPAIR/STENT GRAFT;  Surgeon: Annice Needy, MD;  Location: [facility] INV[facility]IVE CV [facility];  Service: Cardiovascular;  Laterality: N/A;  . EYE SURGERY  2013  . INGUINAL HERNIA REPAIR Right 08/25/2014   Procedure: RIGHT INGUINAL HERNIA REPAIR WITH MESH ;  Surgeon: Ida Rogue, MD;  Location: [facility] ORS;  Service: General;  Laterality: Right;  . INGUINAL HERNIA REPAIR Left 1980   x 2- Dr. Alan Mulder  . INGUINAL HERNIA REPAIR Right   . JOINT REPLACEMENT Left 2007   shoulder  . SHOULDER ARTHROSCOPY Left     Prior to Admission medications   Medication Sig Start Date End Date Taking? Authorizing Provider  acetaminophen (TYLENOL) 500 MG tablet Take 500 mg by mouth every 6 (six) hours as needed.    Yes [provider]  aspirin EC 81 MG tablet Take 81 mg by mouth.   Yes [provider]  dorzolamide-timolol (COSOPT) 22.3-6.8 MG/ML ophthalmic solution Place 1 drop into both eyes 2 (two) times daily. 05/22/17  Yes [provider]  latanoprost (XALATAN) 0.005 % ophthalmic solution Place 1 drop into both eyes at bedtime. 07/06/14  Yes [provider]  lovastatin (MEVACOR) 20 MG tablet Take 1 tablet by mouth every morning.    Yes [provider]  augmented betamethasone dipropionate (DIPROLENE-AF) 0.05 % ointment APPLY TOPICAL 2(TWO) TIMES A DAY Patient not taking: Reported on 07/13/2019 08/21/17    [provider]    Allergies as of 03/28/2020  . (No Known Allergies)    Family History  Problem Relation Age of Onset  . Diabetes Mother     Social History   Socioeconomic History  . Marital status: Married    Spouse name: Not on file  . Number of children: Not on file  . Years of education: Not on file  . Highest education level: Not on file  Occupational History  . Not on file  Tobacco Use  . Smoking status: Former Smoker    Quit date: 07/28/2005    Years since quitting: 14.8  . Smokeless tobacco: Never Used  Vaping Use  . Vaping Use: Never used  Substance and Sexual Activity  . Alcohol use: No  . Drug use: No  . Sexual activity: Not on file  Other Topics Concern  . Not on file  Social History Narrative  . Not on file   Social Determinants of Health   Financial Resource Strain: Not on file  Food Insecurity: Not on file  Transportation Needs: Not on file  Physical Activity: Not on file  Stress: Not on file  Social Connections: Not on file  Intimate Partner Violence: Not on file    Review of Systems: See HPI, otherwise negative ROS  Physical Exam: BP (!) 171/75   Pulse (!) 57   Temp 97.8 F (36.6 C) (Temporal)   Resp 18  Ht 5\' 6"  (1.676 m)   Wt 76.2 kg   SpO2 95%   BMI 27.12 kg/m  General:   Alert,  pleasant and cooperative in NAD Head:  Normocephalic and atraumatic. Respiratory:  Normal work of breathing. Cardiovascular:  RRR  Impression/Plan: Jason Reese is here for cataract surgery.  Risks, benefits, limitations, and alternatives regarding cataract surgery have been reviewed with the patient.  Questions have been answered.  All parties agreeable.   Raford Pitcher, MD  05/16/2020, 11:21 AM

## 2020-05-16 NOTE — Op Note (Signed)
OPERATIVE NOTE  Jason Reese [id number] 05/16/2020   PREOPERATIVE DIAGNOSIS:  Nuclear sclerotic cataract right eye.  H25.11   POSTOPERATIVE DIAGNOSIS:    Nuclear sclerotic cataract right eye.     PROCEDURE:  Phacoemusification with posterior chamber intraocular lens placement of the right eye   LENS:   Implant Name Type Inv. Item Serial No. Manufacturer Lot No. LRB No. Used Action  LENS IOL TECNIS EYHANCE 22.5 - [id number] Intraocular Lens LENS IOL TECNIS EYHANCE 22.5 [id number] [id number]   Right 1 Implanted       Procedure(s) with comments: CATARACT EXTRACTION PHACO AND INTRAOCULAR LENS PLACEMENT (IOC) RIGHT (Right) - 9.39 0:58.0  DIB00 +22.5   ULTRASOUND TIME: 0 minutes 58 seconds.  CDE 9.39   SURGEON:  Willey Blade, MD, MPH  ANESTHESIOLOGIST: Anesthesiologist: Orrin Brigham, MD CRNA: Maree Krabbe, CRNA   ANESTHESIA:  Topical with tetracaine drops augmented with 1% preservative-free intracameral lidocaine.  ESTIMATED BLOOD LOSS: less than 1 mL.   COMPLICATIONS:  None.   DESCRIPTION OF PROCEDURE:  The patient was identified in the holding room and transported to the operating room and placed in the supine position under the operating microscope.  The right eye was identified as the operative eye and it was prepped and draped in the usual sterile ophthalmic fashion.   A 1.0 millimeter clear-corneal paracentesis was made at the 10:30 position. 0.5 ml of preservative-free 1% lidocaine with epinephrine was injected into the anterior chamber.  The anterior chamber was filled with Healon 5 viscoelastic.  A 2.4 millimeter keratome was used to make a near-clear corneal incision at the 8:00 position.  A curvilinear capsulorrhexis was made with a cystotome and capsulorrhexis forceps.  Balanced salt solution was used to hydrodissect and hydrodelineate the nucleus.   Phacoemulsification was then used in stop and chop fashion to remove the lens nucleus and epinucleus.  The remaining  cortex was then removed using the irrigation and aspiration handpiece. Healon was then placed into the capsular bag to distend it for lens placement.  A lens was then injected into the capsular bag.  The remaining viscoelastic was aspirated.   Wounds were hydrated with balanced salt solution.  The anterior chamber was inflated to a physiologic pressure with balanced salt solution.   Intracameral vigamox 0.1 mL undiluted was injected into the eye and a drop placed onto the ocular surface.  No wound leaks were noted.  The patient was taken to the recovery room in stable condition without complications of anesthesia or surgery  Willey Blade 05/16/2020, 11:50 AM

## 2020-05-16 NOTE — Transfer of Care (Signed)
Immediate Anesthesia Transfer of Care Note  Patient: Jason Reese  Procedure(s) Performed: CATARACT EXTRACTION PHACO AND INTRAOCULAR LENS PLACEMENT (IOC) RIGHT (Right Eye)  Patient Location: PACU  Anesthesia Type: MAC  Level of Consciousness: awake, alert  and patient cooperative  Airway and Oxygen Therapy: Patient Spontanous Breathing and Patient connected to supplemental oxygen  Post-op Assessment: Post-op Vital signs reviewed, Patient's Cardiovascular Status Stable, Respiratory Function Stable, Patent Airway and No signs of Nausea or vomiting  Post-op Vital Signs: Reviewed and stable  Complications: No complications documented.

## 2020-05-16 NOTE — Anesthesia Postprocedure Evaluation (Signed)
Anesthesia Post Note  Patient: Jason Reese  Procedure(s) Performed: CATARACT EXTRACTION PHACO AND INTRAOCULAR LENS PLACEMENT (IOC) RIGHT (Right Eye)     Patient location during evaluation: PACU Anesthesia Type: MAC Level of consciousness: awake and alert Pain management: pain level controlled Vital Signs Assessment: post-procedure vital signs reviewed and stable Respiratory status: spontaneous breathing, nonlabored ventilation, respiratory function stable and patient connected to nasal cannula oxygen Cardiovascular status: stable and blood pressure returned to baseline Postop Assessment: no apparent nausea or vomiting Anesthetic complications: no   No complications documented.  Fidel Levy

## 2020-05-16 NOTE — Anesthesia Preprocedure Evaluation (Addendum)
Anesthesia Evaluation  Patient identified by MRN, date of birth, ID band Patient awake    Reviewed: NPO status   History of Anesthesia Complications (+) PONV and history of anesthetic complications  Airway Mallampati: II  TM Distance: >3 FB Neck ROM: full    Dental no notable dental hx. (+) Edentulous Lower, Edentulous Upper   Pulmonary neg pulmonary ROS, former smoker,    Pulmonary exam normal        Cardiovascular Exercise Tolerance: Good Normal cardiovascular exam  AAA stent 2019   Neuro/Psych Glaucoma;  Hard of hearing negative neurological ROS  negative psych ROS   GI/Hepatic Neg liver ROS, GERD  Controlled,  Endo/Other  negative endocrine ROS  Renal/GU negative Renal ROS  negative genitourinary   Musculoskeletal   Abdominal   Peds  Hematology negative hematology ROS (+)   Anesthesia Other Findings abdominal aortic aneurysm status post stent graft placement on 07/24/2017.  Duplex US of the aorta and iliac arteries shows a 2.3 AAA sac with no endoleak  VAsc: Schnier, Dolores Lory, MD at 0/21/1155;  Pt makes clicking noise via mouth.  Reproductive/Obstetrics                            Anesthesia Physical Anesthesia Plan  ASA: III  Anesthesia Plan: MAC   Post-op Pain Management:    Induction:   PONV Risk Score and Plan: 2 and Midazolam and TIVA  Airway Management Planned:   Additional Equipment:   Intra-op Plan:   Post-operative Plan:   Informed Consent: I have reviewed the patients History and Physical, chart, labs and discussed the procedure including the risks, benefits and alternatives for the proposed anesthesia with the patient or authorized representative who has indicated his/her understanding and acceptance.       Plan Discussed with: CRNA  Anesthesia Plan Comments:         Anesthesia Quick Evaluation

## 2020-05-16 NOTE — Anesthesia Procedure Notes (Signed)
Procedure Name: MAC Date/Time: 05/16/2020 11:28 AM Performed by: Cameron Ali, CRNA Pre-anesthesia Checklist: Patient identified, Emergency Drugs available, Suction available, Timeout performed and Patient being monitored Patient Re-evaluated:Patient Re-evaluated prior to induction Oxygen Delivery Method: Nasal cannula Placement Confirmation: positive ETCO2

## 2020-05-17 ENCOUNTER — Encounter: Payer: Self-pay | Admitting: Ophthalmology

## 2020-05-18 ENCOUNTER — Encounter: Payer: Self-pay | Admitting: Ophthalmology

## 2020-05-26 NOTE — Discharge Instructions (Signed)

## 2020-05-30 ENCOUNTER — Ambulatory Visit: Payer: Medicare HMO | Admitting: Anesthesiology

## 2020-05-30 ENCOUNTER — Ambulatory Visit
Admission: RE | Admit: 2020-05-30 | Discharge: 2020-05-30 | Disposition: A | Discharge status: Home / Self Care | Payer: Medicare HMO | Attending: Ophthalmology | Admitting: Ophthalmology

## 2020-05-30 ENCOUNTER — Encounter
Admission: RE | Disposition: A | Discharge status: Home / Self Care | Payer: Self-pay | Source: Home / Self Care | Attending: Ophthalmology

## 2020-05-30 ENCOUNTER — Other Ambulatory Visit: Payer: Self-pay

## 2020-05-30 ENCOUNTER — Encounter: Payer: Self-pay | Admitting: Ophthalmology

## 2020-05-30 DIAGNOSIS — Z7982 Long term (current) use of aspirin: Secondary | ICD-10-CM | POA: Diagnosis not present

## 2020-05-30 DIAGNOSIS — Z95828 Presence of other vascular implants and grafts: Secondary | ICD-10-CM | POA: Insufficient documentation

## 2020-05-30 DIAGNOSIS — Z961 Presence of intraocular lens: Secondary | ICD-10-CM | POA: Insufficient documentation

## 2020-05-30 DIAGNOSIS — Z9841 Cataract extraction status, right eye: Secondary | ICD-10-CM | POA: Insufficient documentation

## 2020-05-30 DIAGNOSIS — Z87891 Personal history of nicotine dependence: Secondary | ICD-10-CM | POA: Insufficient documentation

## 2020-05-30 DIAGNOSIS — H2512 Age-related nuclear cataract, left eye: Secondary | ICD-10-CM | POA: Diagnosis present

## 2020-05-30 DIAGNOSIS — Z79899 Other long term (current) drug therapy: Secondary | ICD-10-CM | POA: Insufficient documentation

## 2020-05-30 HISTORY — PX: CATARACT EXTRACTION W/PHACO: SHX586

## 2020-05-30 SURGERY — PHACOEMULSIFICATION, CATARACT, WITH IOL INSERTION
Anesthesia: Monitor Anesthesia Care | Site: Eye | Laterality: Left

## 2020-05-30 MED ORDER — LIDOCAINE HCL (PF) 2 % IJ SOLN: INTRAOCULAR | Status: DC | PRN

## 2020-05-30 MED ORDER — SODIUM HYALURONATE 23MG/ML IO SOSY
PREFILLED_SYRINGE | INTRAOCULAR | Status: DC | PRN
  Administered 2020-05-30: 0.6 mL via INTRAOCULAR

## 2020-05-30 MED ORDER — FENTANYL CITRATE (PF) 100 MCG/2ML IJ SOLN
INTRAMUSCULAR | Status: DC | PRN
  Administered 2020-05-30: 50 ug via INTRAVENOUS

## 2020-05-30 MED ORDER — SODIUM HYALURONATE 10 MG/ML IO SOLUTION
PREFILLED_SYRINGE | INTRAOCULAR | Status: DC | PRN
  Administered 2020-05-30: 0.55 mL via INTRAOCULAR

## 2020-05-30 MED ORDER — MIDAZOLAM HCL 2 MG/2ML IJ SOLN
INTRAMUSCULAR | Status: DC | PRN
  Administered 2020-05-30: 1 mg via INTRAVENOUS

## 2020-05-30 MED ORDER — EPINEPHRINE PF 1 MG/ML IJ SOLN
INTRAOCULAR | Status: DC | PRN
  Administered 2020-05-30: 121 mL via OPHTHALMIC

## 2020-05-30 MED ORDER — ARMC OPHTHALMIC DILATING DROPS
1.0000 "application " | OPHTHALMIC | Status: DC | PRN
  Administered 2020-05-30 (×3): 1 via OPHTHALMIC

## 2020-05-30 MED ORDER — MOXIFLOXACIN HCL 0.5 % OP SOLN
OPHTHALMIC | Status: DC | PRN
  Administered 2020-05-30: 0.2 mL via OPHTHALMIC

## 2020-05-30 MED ORDER — ACETAMINOPHEN 325 MG PO TABS
325.0000 mg | ORAL_TABLET | ORAL | Status: DC | PRN
Start: 2020-05-30 — End: 2020-05-30

## 2020-05-30 MED ORDER — ONDANSETRON HCL 4 MG/2ML IJ SOLN: 4.0000 mg | Freq: Once | INTRAMUSCULAR | Status: DC | PRN

## 2020-05-30 MED ORDER — TETRACAINE HCL 0.5 % OP SOLN
1.0000 [drp] | OPHTHALMIC | Status: DC | PRN
  Administered 2020-05-30 (×3): 1 [drp] via OPHTHALMIC

## 2020-05-30 MED ORDER — ACETAMINOPHEN 160 MG/5ML PO SOLN
325.0000 mg | ORAL | Status: DC | PRN
Start: 2020-05-30 — End: 2020-05-30

## 2020-05-30 SURGICAL SUPPLY — 17 items
CANNULA ANT/CHMB 27GA (MISCELLANEOUS) ×4 IMPLANT
DISSECTOR HYDRO NUCLEUS 50X22 (MISCELLANEOUS) ×2 IMPLANT
GLOVE PI ULTRA LF STRL 7.5 (GLOVE) ×2 IMPLANT
GLOVE PI ULTRA NON LATEX 7.5 (GLOVE) ×2
GLOVE SURG SYN 8.5  E (GLOVE) ×1
GLOVE SURG SYN 8.5 E (GLOVE) ×1 IMPLANT
GOWN STRL REUS W/ TWL LRG LVL3 (GOWN DISPOSABLE) ×2 IMPLANT
GOWN STRL REUS W/TWL LRG LVL3 (GOWN DISPOSABLE) ×4
LENS IOL TECNIS EYHANCE 24.5 (Intraocular Lens) ×2 IMPLANT
MARKER SKIN DUAL TIP RULER LAB (MISCELLANEOUS) ×2 IMPLANT
PACK DR. KING ARMS (PACKS) ×2 IMPLANT
PACK EYE AFTER SURG (MISCELLANEOUS) ×2 IMPLANT
PACK OPTHALMIC (MISCELLANEOUS) ×2 IMPLANT
SYR 3ML LL SCALE MARK (SYRINGE) ×2 IMPLANT
SYR TB 1ML LUER SLIP (SYRINGE) ×2 IMPLANT
WATER STERILE IRR 250ML POUR (IV SOLUTION) ×2 IMPLANT
WIPE NON LINTING 3.25X3.25 (MISCELLANEOUS) ×2 IMPLANT

## 2020-05-30 NOTE — Anesthesia Preprocedure Evaluation (Signed)
Anesthesia Evaluation  Patient identified by MRN, date of birth, ID band Patient awake    Reviewed: Allergy & Precautions, NPO status   History of Anesthesia Complications (+) PONV and history of anesthetic complications  Airway Mallampati: II  TM Distance: >3 FB Neck ROM: full    Dental  (+) Edentulous Lower, Edentulous Upper   Pulmonary COPD, former smoker,    Pulmonary exam normal        Cardiovascular Exercise Tolerance: Good Normal cardiovascular exam  AAA stent 2019   Neuro/Psych Glaucoma;  Hard of hearing    GI/Hepatic GERD  Controlled,  Endo/Other    Renal/GU      Musculoskeletal   Abdominal   Peds  Hematology   Anesthesia Other Findings abdominal aortic aneurysm status post stent graft placement on 07/24/2017.  Duplex US of the aorta and iliac arteries shows a 2.3 AAA sac with no endoleak  VAsc: Schnier, Dolores Lory, MD at 0/10/9321;  Pt makes clicking noise via mouth.  Reproductive/Obstetrics                             Anesthesia Physical  Anesthesia Plan  ASA: III  Anesthesia Plan: MAC   Post-op Pain Management:    Induction:   PONV Risk Score and Plan: 2 and Midazolam and TIVA  Airway Management Planned: Nasal Cannula and Natural Airway  Additional Equipment:   Intra-op Plan:   Post-operative Plan:   Informed Consent: I have reviewed the patients History and Physical, chart, labs and discussed the procedure including the risks, benefits and alternatives for the proposed anesthesia with the patient or authorized representative who has indicated his/her understanding and acceptance.       Plan Discussed with: CRNA  Anesthesia Plan Comments:         Anesthesia Quick Evaluation

## 2020-05-30 NOTE — Anesthesia Procedure Notes (Signed)
Procedure Name: MAC Date/Time: 05/30/2020 8:40 AM Performed by: Jeannene Patella, CRNA Pre-anesthesia Checklist: Patient identified, Emergency Drugs available, Suction available, Timeout performed and Patient being monitored Patient Re-evaluated:Patient Re-evaluated prior to induction Oxygen Delivery Method: Nasal cannula Placement Confirmation: positive ETCO2

## 2020-05-30 NOTE — Transfer of Care (Signed)
Immediate Anesthesia Transfer of Care Note  Patient: Jason Reese  Procedure(s) Performed: CATARACT EXTRACTION PHACO AND INTRAOCULAR LENS PLACEMENT (IOC) LEFT (Left Eye)  Patient Location: PACU  Anesthesia Type: MAC  Level of Consciousness: awake, alert  and patient cooperative  Airway and Oxygen Therapy: Patient Spontanous Breathing and Patient connected to supplemental oxygen  Post-op Assessment: Post-op Vital signs reviewed, Patient's Cardiovascular Status Stable, Respiratory Function Stable, Patent Airway and No signs of Nausea or vomiting  Post-op Vital Signs: Reviewed and stable  Complications: No complications documented.

## 2020-05-30 NOTE — H&P (Signed)
[facility]   Primary Care Physician:  Jaclyn Shaggy, MD Ophthalmologist: Dr. Willey Blade  Pre-Procedure History & Physical: HPI:  Jason Reese is a 80 y.o. male here for cataract surgery.   Past Medical History:  Diagnosis Date  . Aneurysm (HCC)   . Cataract   . Glaucoma   . Hard of hearing   . History of kidney stones   . Hyperlipidemia   . PONV (postoperative nausea and vomiting)   . Wears hearing aid in both ears     Past Surgical History:  Procedure Laterality Date  . CATARACT EXTRACTION W/PHACO Right 05/16/2020   Procedure: CATARACT EXTRACTION PHACO AND INTRAOCULAR LENS PLACEMENT (IOC) RIGHT;  Surgeon: Nevada Crane, MD;  Location: [facility];  Service: Ophthalmology;  Laterality: Right;  9.39 0:58.0  . ENDOVASCULAR REPAIR/STENT GRAFT N/A 07/24/2017   Procedure: ENDOVASCULAR REPAIR/STENT GRAFT;  Surgeon: Annice Needy, MD;  Location: [facility] INVASIVE CV LAB;  Service: Cardiovascular;  Laterality: N/A;  . EYE SURGERY  2013  . INGUINAL HERNIA REPAIR Right 08/25/2014   Procedure: RIGHT INGUINAL HERNIA REPAIR WITH MESH ;  Surgeon: Ida Rogue, MD;  Location: [facility] ORS;  Service: General;  Laterality: Right;  . INGUINAL HERNIA REPAIR Left 1980   x 2- Dr. Alan Mulder  . INGUINAL HERNIA REPAIR Right   . JOINT REPLACEMENT Left 2007   shoulder  . SHOULDER ARTHROSCOPY Left     Prior to Admission medications   Medication Sig Start Date End Date Taking? Authorizing Provider  acetaminophen (TYLENOL) 500 MG tablet Take 500 mg by mouth every 6 (six) hours as needed.    Yes [provider]  aspirin EC 81 MG tablet Take 81 mg by mouth.   Yes [provider]  augmented betamethasone dipropionate (DIPROLENE-AF) 0.05 % ointment APPLY TOPICAL 2(TWO) TIMES A DAY 08/21/17  Yes [provider]  dorzolamide-timolol (COSOPT) 22.3-6.8 MG/ML ophthalmic solution Place 1 drop into both eyes 2 (two) times daily. 05/22/17  Yes [provider]  latanoprost (XALATAN) 0.005 % ophthalmic solution Place 1 drop into both eyes at bedtime. 07/06/14  Yes [provider]  lovastatin (MEVACOR) 20 MG tablet Take 1 tablet by mouth every morning.    Yes [provider]    Allergies as of 03/28/2020  . (No Known Allergies)    Family History  Problem Relation Age of Onset  . Diabetes Mother     Social History   Socioeconomic History  . Marital status: Married    Spouse name: Not on file  . Number of children: Not on file  . Years of education: Not on file  . Highest education level: Not on file  Occupational History  . Not on file  Tobacco Use  . Smoking status: Former Smoker    Quit date: 07/28/2005    Years since quitting: 14.8  . Smokeless tobacco: Never Used  Vaping Use  . Vaping Use: Never used  Substance and Sexual Activity  . Alcohol use: No  . Drug use: No  . Sexual activity: Not on file  Other Topics Concern  . Not on file  Social History Narrative  . Not on file   Social Determinants of Health   Financial Resource Strain: Not on file  Food Insecurity: Not on file  Transportation Needs: Not on file  Physical Activity: Not on file  Stress: Not on file  Social Connections: Not on file  Intimate Partner Violence: Not on file  Review of Systems: See HPI, otherwise negative ROS  Physical Exam: BP (!) 168/79   Pulse (!) 55   Temp (!) 97.5 F (36.4 C)   Wt 74.8 kg   SpO2 95%   BMI 26.63 kg/m  General:   Alert,  pleasant and cooperative in NAD Head:  Normocephalic and atraumatic. Respiratory:  Normal work of breathing. Cardiovascular:  RRR  Impression/Plan: Jason Reese is here for cataract surgery.  Risks, benefits, limitations, and alternatives regarding cataract surgery have been reviewed with the patient.  Questions have been answered.  All parties agreeable.   Willey Blade, MD  05/30/2020, 8:30 AM

## 2020-05-30 NOTE — Anesthesia Postprocedure Evaluation (Signed)
Anesthesia Post Note  Patient: Jason Reese  Procedure(s) Performed: CATARACT EXTRACTION PHACO AND INTRAOCULAR LENS PLACEMENT (IOC) LEFT (Left Eye)     Patient location during evaluation: PACU Anesthesia Type: MAC Level of consciousness: awake Pain management: pain level controlled Vital Signs Assessment: post-procedure vital signs reviewed and stable Respiratory status: respiratory function stable Cardiovascular status: stable Postop Assessment: no apparent nausea or vomiting Anesthetic complications: no   No complications documented.  Veda Canning

## 2020-05-30 NOTE — Op Note (Signed)
OPERATIVE NOTE  Jason Reese [id number] 05/30/2020   PREOPERATIVE DIAGNOSIS:  Nuclear sclerotic cataract left eye.  H25.12   POSTOPERATIVE DIAGNOSIS:    Nuclear sclerotic cataract left eye.     PROCEDURE:  Phacoemusification with posterior chamber intraocular lens placement of the left eye   LENS:   Implant Name Type Inv. Item Serial No. Manufacturer Lot No. LRB No. Used Action  LENS IOL TECNIS EYHANCE 24.5 - [id number] Intraocular Lens LENS IOL TECNIS EYHANCE 24.5 [id number] JOHNSON   Left 1 Implanted      Procedure(s) with comments: CATARACT EXTRACTION PHACO AND INTRAOCULAR LENS PLACEMENT (IOC) LEFT (Left) - 9.04 0:58.7  DIB00 +24.5   ULTRASOUND TIME: 0 minutes 58 seconds.  CDE 9.04   SURGEON:  Willey Blade, MD, MPH   ANESTHESIA:  Topical with tetracaine drops augmented with 1% preservative-free intracameral lidocaine.  ESTIMATED BLOOD LOSS: <1 mL   COMPLICATIONS:  None.   DESCRIPTION OF PROCEDURE:  The patient was identified in the holding room and transported to the operating room and placed in the supine position under the operating microscope.  The left eye was identified as the operative eye and it was prepped and draped in the usual sterile ophthalmic fashion.   A 1.0 millimeter clear-corneal paracentesis was made at the 5:00 position. 0.5 ml of preservative-free 1% lidocaine with epinephrine was injected into the anterior chamber.  The anterior chamber was filled with Healon 5 viscoelastic.  A 2.4 millimeter keratome was used to make a near-clear corneal incision at the 2:00 position.  A curvilinear capsulorrhexis was made with a cystotome and capsulorrhexis forceps.  Balanced salt solution was used to hydrodissect and hydrodelineate the nucleus.   Phacoemulsification was then used in stop and chop fashion to remove the lens nucleus and epinucleus.  The remaining cortex was then removed using the irrigation and aspiration handpiece. Healon was then placed into the  capsular bag to distend it for lens placement.  A lens was then injected into the capsular bag.  The remaining viscoelastic was aspirated.   Wounds were hydrated with balanced salt solution.  The anterior chamber was inflated to a physiologic pressure with balanced salt solution.  Intracameral vigamox 0.1 mL undiltued was injected into the eye and a drop placed onto the ocular surface.  No wound leaks were noted.  The patient was taken to the recovery room in stable condition without complications of anesthesia or surgery  Willey Blade 05/30/2020, 9:00 AM

## 2020-05-31 ENCOUNTER — Encounter: Payer: Self-pay | Admitting: Ophthalmology

## 2020-07-11 ENCOUNTER — Ambulatory Visit (INDEPENDENT_AMBULATORY_CARE_PROVIDER_SITE_OTHER): Payer: Medicare HMO | Admitting: Vascular Surgery

## 2020-07-11 ENCOUNTER — Other Ambulatory Visit: Payer: Self-pay

## 2020-07-11 ENCOUNTER — Encounter (INDEPENDENT_AMBULATORY_CARE_PROVIDER_SITE_OTHER): Payer: Self-pay | Admitting: Vascular Surgery

## 2020-07-11 ENCOUNTER — Ambulatory Visit (INDEPENDENT_AMBULATORY_CARE_PROVIDER_SITE_OTHER): Payer: Medicare HMO

## 2020-07-11 VITALS — BP 167/80 | HR 60 | Resp 16 | Wt 168.0 lb

## 2020-07-11 DIAGNOSIS — E785 Hyperlipidemia, unspecified: Secondary | ICD-10-CM

## 2020-07-11 DIAGNOSIS — I714 Abdominal aortic aneurysm, without rupture, unspecified: Secondary | ICD-10-CM

## 2020-07-11 DIAGNOSIS — J439 Emphysema, unspecified: Secondary | ICD-10-CM | POA: Diagnosis not present

## 2020-07-11 NOTE — Progress Notes (Signed)
MRN : [id number]  Jason Reese is a 80 y.o. (March 04, 1940) male who presents with chief complaint of  Chief Complaint  Patient presents with   Follow-up    Ultrasound follow up  .  History of Present Illness:  The patient returns to the office for surveillance of an abdominal aortic aneurysm status post stent graft placement on 07/24/2017.   Patient denies abdominal pain or back pain, no other abdominal complaints. No groin related complaints. No symptoms consistent with distal embolization No changes in claudication distance.   There have been no interval changes in his overall healthcare since his last visit.   Patient denies amaurosis fugax or TIA symptoms. There is no history of claudication or rest pain symptoms of the lower extremities. The patient denies angina or shortness of breath.   Duplex US of the aorta and iliac arteries shows a 3.9 cm sac (previously 2.3 AAA sac) with no endoleak, possible increase change in the sac diameter compared to the previous study.  Current Meds  Medication Sig   acetaminophen (TYLENOL) 500 MG tablet Take 500 mg by mouth every 6 (six) hours as needed.    aspirin EC 81 MG tablet Take 81 mg by mouth.   augmented betamethasone dipropionate (DIPROLENE-AF) 0.05 % ointment APPLY TOPICAL 2(TWO) TIMES A DAY   dorzolamide-timolol (COSOPT) 22.3-6.8 MG/ML ophthalmic solution Place 1 drop into both eyes 2 (two) times daily.   latanoprost (XALATAN) 0.005 % ophthalmic solution Place 1 drop into both eyes at bedtime.   lovastatin (MEVACOR) 20 MG tablet Take 1 tablet by mouth every morning.     Past Medical History:  Diagnosis Date   Aneurysm (HCC)    Cataract    Glaucoma    Hard of hearing    History of kidney stones    Hyperlipidemia    PONV (postoperative nausea and vomiting)    Wears hearing aid in both ears     Past Surgical History:  Procedure Laterality Date   CATARACT EXTRACTION W/PHACO Right 05/16/2020   Procedure: CATARACT EXTRACTION  PHACO AND INTRAOCULAR LENS PLACEMENT (IOC) RIGHT;  Surgeon: Nevada Crane, MD;  Location: [facility];  Service: Ophthalmology;  Laterality: Right;  9.39 0:58.0   CATARACT EXTRACTION W/PHACO Left 05/30/2020   Procedure: CATARACT EXTRACTION PHACO AND INTRAOCULAR LENS PLACEMENT (IOC) LEFT;  Surgeon: Nevada Crane, MD;  Location: [facility];  Service: Ophthalmology;  Laterality: Left;  9.04 0:58.7   ENDOVASCULAR REPAIR/STENT GRAFT N/A 07/24/2017   Procedure: ENDOVASCULAR REPAIR/STENT GRAFT;  Surgeon: Annice Needy, MD;  Location: [facility];  Service: Cardiovascular;  Laterality: N/A;   EYE SURGERY  2013   INGUINAL HERNIA REPAIR Right 08/25/2014   Procedure: RIGHT INGUINAL HERNIA REPAIR WITH MESH ;  Surgeon: Ida Rogue, MD;  Location: [facility] OR[facility];  Service: General;  Laterality: Right;   INGUINAL HERNIA REPAIR Left 1980   x 2- Dr. Alan Mulder   INGUINAL HERNIA REPAIR Right    JOINT REPLACEMENT Left 2007   shoulder   SHOULDER ARTHROSCOPY Left     Social History Social History   Tobacco Use   Smoking status: Former    Pack years: 0.00    Types: Cigarettes    Quit date: 07/28/2005    Years since quitting: 14.9   Smokeless tobacco: Never  Vaping Use   Vaping Use: Never used  Substance Use Topics   Alcohol use: No   Drug use: No    Family History Family History  Problem Relation Age of Onset   Diabetes Mother     No Known Allergies   REVIEW OF SYSTEMS (Negative unless checked)  Constitutional: [] Weight loss  [] Fever  [] Chills Cardiac: [] Chest pain   [] Chest pressure   [] Palpitations   [] Shortness of breath when laying flat   [] Shortness of breath with exertion. Vascular:  [] Pain in legs with walking   [] Pain in legs at rest  [] History of DVT   [] Phlebitis   [] Swelling in legs   [] Varicose veins   [] Non-healing ulcers Pulmonary:   [] Uses home oxygen   [] Productive cough   [] Hemoptysis   [] Wheeze  [] COPD   [] Asthma Neurologic:  [] Dizziness    [] Seizures   [] History of stroke   [] History of TIA  [] Aphasia   [] Vissual changes   [] Weakness or numbness in arm   [] Weakness or numbness in leg Musculoskeletal:   [] Joint swelling   [] Joint pain   [] Low back pain Hematologic:  [] Easy bruising  [] Easy bleeding   [] Hypercoagulable state   [] Anemic Gastrointestinal:  [] Diarrhea   [] Vomiting  [] Gastroesophageal reflux/heartburn   [] Difficulty swallowing. Genitourinary:  [] Chronic kidney disease   [] Difficult urination  [] Frequent urination   [] Blood in urine Skin:  [] Rashes   [] Ulcers  Psychological:  [] History of anxiety   []  History of major depression.  Physical Examination  Vitals:   07/11/20 0912  BP: (!) 167/80  Pulse: 60  Resp: 16  Weight: 168 lb (76.2 kg)   Body mass index is 27.12 kg/m. Gen: WD/WN, NAD Head: [place]/AT, No temporalis wasting.  Ear/Nose/Throat: Hearing grossly intact, nares w/o erythema or drainage Eyes: PER, EOMI, sclera nonicteric.  Neck: Supple, no large masses.   Pulmonary:  Good air movement, no audible wheezing bilaterally, no use of accessory muscles.  Cardiac: RRR, no JVD Vascular:  Vessel Right Left  Radial Palpable Palpable  PT Palpable Palpable  DP Palpable Palpable  Gastrointestinal: Non-distended. No guarding/no peritoneal signs.  Musculoskeletal: M/S 5/5 throughout.  No deformity or atrophy.  Neurologic: CN 2-12 intact. Symmetrical.  Speech is fluent. Motor exam as listed above. Psychiatric: Judgment intact, Mood & affect appropriate for pt's clinical situation. Dermatologic: No rashes or ulcers noted.  No changes consistent with cellulitis. Lymph : No lichenification or skin changes of chronic lymphedema.  CBC Lab Results  Component Value Date   WBC 13.9 (H) 07/25/2017   HGB 13.1 07/25/2017   HCT 40.0 07/25/2017   MCV 80.9 07/25/2017   PLT 170 07/25/2017    BMET    Component Value Date/Time   NA 139 07/25/2017 0551   K 3.8 07/25/2017 0551   CL 109 07/25/2017 0551   CO2 22  07/25/2017 0551   GLUCOSE 97 07/25/2017 0551   BUN 11 07/25/2017 0551   CREATININE 0.62 07/25/2017 0551   CREATININE 0.98 04/02/2012 0848   CALCIUM 9.0 07/25/2017 0551   GFRNONAA >60 07/25/2017 0551   GFRNONAA >60 04/02/2012 0848   GFRAA >60 07/25/2017 0551   GFRAA >60 04/02/2012 0848   CrCl cannot be calculated (Patient's most recent lab result is older than the maximum 21 days allowed.).  COAG Lab Results  Component Value Date   INR 0.93 07/18/2017    Radiology No results found.   Assessment/Plan 1. AAA (abdominal aortic aneurysm) without rupture (HCC) Recommend:  Patient is status post successful endovascular repair of the AAA.   No further intervention is required at this time.   No endoleak is detected but the aneurysm sac did grow somewhat.  If there is further growth noted with the next study then a CT will be obtained.   The patient will continue antiplatelet therapy as prescribed as well as aggressive management of hyperlipidemia. Exercise is again strongly encouraged.   However, endografts require continued surveillance with ultrasound or CT scan. This is mandatory to detect any changes that allow repressurization of the aneurysm sac.  The patient is informed that this would be asymptomatic.   The patient is reminded that lifelong routine surveillance is a necessity with an endograft. Patient will continue to follow-up at 12 month intervals with ultrasound of the  - VAS US AORTA/IVC/ILIACS; Future  2. Pulmonary emphysema, unspecified emphysema type (HCC) Continue pulmonary medications and aerosols as already ordered, these medications have been reviewed and there are no changes at this time.    3. Hyperlipidemia, unspecified hyperlipidemia type Continue statin as ordered and reviewed, no changes at this time     Levora Dredge, MD  07/11/2020 9:15 AM

## 2020-07-12 ENCOUNTER — Encounter (INDEPENDENT_AMBULATORY_CARE_PROVIDER_SITE_OTHER): Payer: Self-pay | Admitting: Vascular Surgery

## 2021-07-10 ENCOUNTER — Ambulatory Visit (INDEPENDENT_AMBULATORY_CARE_PROVIDER_SITE_OTHER): Payer: Medicare HMO

## 2021-07-10 ENCOUNTER — Ambulatory Visit (INDEPENDENT_AMBULATORY_CARE_PROVIDER_SITE_OTHER): Payer: Medicare HMO | Admitting: Vascular Surgery

## 2021-07-10 ENCOUNTER — Encounter (INDEPENDENT_AMBULATORY_CARE_PROVIDER_SITE_OTHER): Payer: Self-pay | Admitting: Vascular Surgery

## 2021-07-10 VITALS — BP 180/82 | HR 52 | Resp 16 | Wt 171.4 lb

## 2021-07-10 DIAGNOSIS — I714 Abdominal aortic aneurysm, without rupture, unspecified: Secondary | ICD-10-CM

## 2021-07-10 DIAGNOSIS — I7143 Infrarenal abdominal aortic aneurysm, without rupture: Secondary | ICD-10-CM

## 2021-07-10 DIAGNOSIS — B880 Other acariasis: Secondary | ICD-10-CM | POA: Diagnosis not present

## 2021-07-10 DIAGNOSIS — E785 Hyperlipidemia, unspecified: Secondary | ICD-10-CM

## 2021-07-10 DIAGNOSIS — J439 Emphysema, unspecified: Secondary | ICD-10-CM

## 2021-07-10 NOTE — Progress Notes (Signed)
MRN : [id number]  Jason Reese is a 81 y.o. (12/01/1940) male who presents with chief complaint of check circulation.  History of Present Illness:   The patient returns to the office for surveillance of an abdominal aortic aneurysm status post stent graft placement on 07/24/2017.   Patient denies abdominal pain or back pain, no other abdominal complaints. No groin related complaints. No symptoms consistent with distal embolization No changes in claudication distance.   There have been no interval changes in his overall healthcare since his last visit.   Patient denies amaurosis fugax or TIA symptoms. There is no history of claudication or rest pain symptoms of the lower extremities. The patient denies angina or shortness of breath.   Duplex US of the aorta and iliac arteries shows a 4.1 cm sac (previously 3.9 cm AAA sac) with no endoleak, possible increase change in the sac diameter compared to the previous study.  Current Meds  Medication Sig   acetaminophen (TYLENOL) 500 MG tablet Take 500 mg by mouth every 6 (six) hours as needed.    aspirin EC 81 MG tablet Take 81 mg by mouth.   augmented betamethasone dipropionate (DIPROLENE-AF) 0.05 % ointment APPLY TOPICAL 2(TWO) TIMES A DAY   dorzolamide-timolol (COSOPT) 22.3-6.8 MG/ML ophthalmic solution Place 1 drop into both eyes 2 (two) times daily.   latanoprost (XALATAN) 0.005 % ophthalmic solution Place 1 drop into both eyes at bedtime.   lovastatin (MEVACOR) 20 MG tablet Take 1 tablet by mouth every morning.     Past Medical History:  Diagnosis Date   Aneurysm (HCC)    Cataract    Glaucoma    Hard of hearing    History of kidney stones    Hyperlipidemia    PONV (postoperative nausea and vomiting)    Wears hearing aid in both ears     Past Surgical History:  Procedure Laterality Date   CATARACT EXTRACTION W/PHACO Right 05/16/2020   Procedure: CATARACT EXTRACTION PHACO AND INTRAOCULAR LENS PLACEMENT (IOC) RIGHT;   Surgeon: Nevada CraneKing, Bradley Mark, MD;  [facility];  Service: Ophthalmology;  Laterality: Right;  9.39 0:58.0   CATARACT EXTRACTION W/PHACO Left 05/30/2020   Procedure: CATARACT EXTRACTION PHACO AND INTRAOCULAR LENS PLACEMENT (IOC) LEFT;  Surgeon: Nevada CraneKing, Bradley Mark, MD;  [facility];  Service: Ophthalmology;  Laterality: Left;  9.04 0:58.7   ENDOVASCULAR REPAIR/STENT GRAFT N/A 07/24/2017   Procedure: ENDOVASCULAR REPAIR/STENT GRAFT;  Surgeon: Annice Needyew, Jason S, MD;  Location: [facility];  Service: Cardiovascular;  Laterality: N/A;   EYE SURGERY  2013   INGUINAL HERNIA REPAIR Right 08/25/2014   Procedure: RIGHT INGUINAL HERNIA REPAIR WITH MESH ;  Surgeon: Ida Roguehristopher Lundquist, MD;  Location: [facility];  Service: General;  Laterality: Right;   INGUINAL HERNIA REPAIR Left 1980   x 2- Dr. Alan MulderSmidberg   INGUINAL HERNIA REPAIR Right    JOINT REPLACEMENT Left 2007   shoulder   SHOULDER ARTHROSCOPY Left     Social History Social History   Tobacco Use   Smoking status: Former    Types: Cigarettes    Quit date: 07/28/2005    Years since quitting: 15.9   Smokeless tobacco: Never  Vaping Use   Vaping Use: Never used  Substance Use Topics   Alcohol use: No   Drug use: No    Family History Family History  Problem Relation Age of Onset   Diabetes Mother     No Known Allergies  REVIEW OF SYSTEMS (Negative unless checked)  Constitutional: [] Weight loss  [] Fever  [] Chills Cardiac: [] Chest pain   [] Chest pressure   [] Palpitations   [] Shortness of breath when laying flat   [] Shortness of breath with exertion. Vascular:  [x] Pain in legs with walking   [] Pain in legs at rest  [] History of DVT   [] Phlebitis   [] Swelling in legs   [] Varicose veins   [] Non-healing ulcers Pulmonary:   [] Uses home oxygen   [] Productive cough   [] Hemoptysis   [] Wheeze  [] COPD   [] Asthma Neurologic:  [] Dizziness   [] Seizures   [] History of stroke   [] History of TIA  [] Aphasia    [] Vissual changes   [] Weakness or numbness in arm   [] Weakness or numbness in leg Musculoskeletal:   [] Joint swelling   [] Joint pain   [] Low back pain Hematologic:  [] Easy bruising  [] Easy bleeding   [] Hypercoagulable state   [] Anemic Gastrointestinal:  [] Diarrhea   [] Vomiting  [] Gastroesophageal reflux/heartburn   [] Difficulty swallowing. Genitourinary:  [] Chronic kidney disease   [] Difficult urination  [] Frequent urination   [] Blood in urine Skin:  [x] Rashes   [] Ulcers  Psychological:  [] History of anxiety   []  History of major depression.  Physical Examination  Vitals:   07/10/21 0859  BP: (!) 180/82  Pulse: (!) 52  Resp: 16  Weight: 171 lb 6.4 oz (77.7 kg)   Body mass index is 27.66 kg/m. Gen: WD/WN, NAD Head: [place]/AT, No temporalis wasting.  Ear/Nose/Throat: Hearing grossly intact, nares w/o erythema or drainage Eyes: PER, EOMI, sclera nonicteric.  Neck: Supple, no masses.  No bruit or JVD.  Pulmonary:  Good air movement, no audible wheezing, no use of accessory muscles.  Cardiac: RRR, normal S1, S2, no Murmurs. Vascular:  Rash left ankle and calf c/w chigger bites, no open wounds Vessel Right Left  Radial Palpable Palpable  PT Not Palpable Not Palpable  DP Not Palpable Not Palpable  Gastrointestinal: soft, non-distended. No guarding/no peritoneal signs.  Musculoskeletal: M/S 5/5 throughout.  No visible deformity.  Neurologic: CN 2-12 intact. Pain and light touch intact in extremities.  Symmetrical.  Speech is fluent. Motor exam as listed above. Psychiatric: Judgment intact, Mood & affect appropriate for pt's clinical situation. Dermatologic: No rashes or ulcers noted.  No changes consistent with cellulitis.   CBC Lab Results  Component Value Date   WBC 13.9 (H) 07/25/2017   HGB 13.1 07/25/2017   HCT 40.0 07/25/2017   MCV 80.9 07/25/2017   PLT 170 07/25/2017    BMET    Component Value Date/Time   NA 139 07/25/2017 0551   K 3.8 07/25/2017 0551   CL 109  07/25/2017 0551   CO2 22 07/25/2017 0551   GLUCOSE 97 07/25/2017 0551   BUN 11 07/25/2017 0551   CREATININE 0.62 07/25/2017 0551   CREATININE 0.98 04/02/2012 0848   CALCIUM 9.0 07/25/2017 0551   GFRNONAA >60 07/25/2017 0551   GFRNONAA >60 04/02/2012 0848   GFRAA >60 07/25/2017 0551   GFRAA >60 04/02/2012 0848   CrCl cannot be calculated (Patient's most recent lab result is older than the maximum 21 days allowed.).  COAG Lab Results  Component Value Date   INR 0.93 07/18/2017    Radiology No results found.   Assessment/Plan 1. 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   No further intervention is required at this time.   No endoleak is detected but the aneurysm  sac did grow somewhat.  If there is further growth noted with the next study then a CT will be obtained.   The patient will continue antiplatelet therapy as prescribed as well as aggressive management of hyperlipidemia. Exercise is again strongly encouraged.   However, endografts require continued surveillance with ultrasound or CT scan. This is mandatory to detect any changes that allow repressurization of the aneurysm sac.  The patient is informed that this would be asymptomatic.   The patient is reminded that lifelong routine surveillance is a necessity with an endograft. Patient will continue to follow-up at 12 month intervals with ultrasound of the   - VAS US AORTA/IVC/ILIACS; Future  2. Chigger bites Recommend: Calamine lotion and benadryl  3. Pulmonary emphysema, unspecified emphysema type (HCC) Continue pulmonary medications and aerosols as already ordered, these medications have been reviewed and there are no changes at this time.    4. Hyperlipidemia, unspecified hyperlipidemia type Continue statin as ordered and reviewed, no changes at this time     Levora Dredge, MD  07/10/2021 10:42 AM

## 2021-07-10 NOTE — Progress Notes (Signed)
Fol

## 2022-01-04 ENCOUNTER — Other Ambulatory Visit (INDEPENDENT_AMBULATORY_CARE_PROVIDER_SITE_OTHER): Payer: Self-pay | Admitting: Vascular Surgery

## 2022-01-04 DIAGNOSIS — M79605 Pain in left leg: Secondary | ICD-10-CM

## 2022-01-07 NOTE — Progress Notes (Signed)
MRN : [id number]  Jason Reese is a 81 y.o. ([birth year]) male who presents with chief complaint of check circulation.  History of Present Illness:   The patient returns to the office for surveillance of an abdominal aortic aneurysm status post stent graft placement on 07/24/2017.  Procedure: Placement of a 28 x 12 x 16 C3 Gore Excluder Endoprosthesis main body with a 12 x 12 contralateral limb   Patient denies abdominal pain or back pain, no other abdominal complaints. No groin related complaints. No symptoms consistent with distal embolization No changes in claudication distance.   There have been no interval changes in his overall healthcare since his last visit.   Patient denies amaurosis fugax or TIA symptoms. There is no history of claudication or rest pain symptoms of the lower extremities. The patient denies angina or shortness of breath.   Duplex US of the aorta and iliac arteries shows a 3.2 cm sac (previously 4.1 cm AAA sac) with no endoleak, decrease in the sac diameter compared to the previous study.  ABI's Rt=1.15 and Lt=1.25 (triphasic PT artery signals bilaterally.  No outpatient medications have been marked as taking for the 01/08/22 encounter (Appointment) with Gilda Crease, Latina Craver, MD.    Past Medical History:  Diagnosis Date   Aneurysm (HCC)    Cataract    Glaucoma    Hard of hearing    History of kidney stones    Hyperlipidemia    PONV (postoperative nausea and vomiting)    Wears hearing aid in both ears     Past Surgical History:  Procedure Laterality Date   CATARACT EXTRACTION W/PHACO Right 05/16/2020   Procedure: CATARACT EXTRACTION PHACO AND INTRAOCULAR LENS PLACEMENT (IOC) RIGHT;  Surgeon: Nevada Crane, MD;  Location: [facility];  Service: Ophthalmology;  Laterality: Right;  9.39 0:58.0   CATARACT EXTRACTION W/PHACO Left 05/30/2020   Procedure: CATARACT EXTRACTION PHACO AND INTRAOCULAR LENS PLACEMENT (IOC) LEFT;   Surgeon: Nevada Crane, MD;  Location: [facility];  Service: Ophthalmology;  Laterality: Left;  9.04 0:58.7   ENDOVASCULAR REPAIR/STENT GRAFT N/A 07/24/2017   Procedure: ENDOVASCULAR REPAIR/STENT GRAFT;  Surgeon: Annice Needy, MD;  Location: [facility];  Service: Cardiovascular;  Laterality: N/A;   EYE SURGERY  2013   INGUINAL HERNIA REPAIR Right 08/25/2014   Procedure: RIGHT INGUINAL HERNIA REPAIR WITH MESH ;  Surgeon: Ida Rogue, MD;  Location: [facility];  Service: General;  Laterality: Right;   INGUINAL HERNIA REPAIR Left 1980   x 2- Dr. Alan Mulder   INGUINAL HERNIA REPAIR Right    JOINT REPLACEMENT Left 2007   shoulder   SHOULDER ARTHROSCOPY Left     Social History Social History   Tobacco Use   Smoking status: Former    Types: Cigarettes    Quit date: 07/28/2005    Years since quitting: 16.4   Smokeless tobacco: Never  Vaping Use   Vaping Use: Never used  Substance Use Topics   Alcohol use: No   Drug use: No    Family History Family History  Problem Relation Age of Onset   Diabetes Mother     No Known Allergies   REVIEW OF SYSTEMS (Negative unless checked)  Constitutional: [] Weight loss  [] Fever  [] Chills Cardiac: [] Chest pain   [] Chest pressure   [] Palpitations   [] Shortness of breath when laying flat   [] Shortness of breath with exertion. Vascular:  [  x]Pain in legs with walking   [] Pain in legs at rest  [] History of DVT   [] Phlebitis   [] Swelling in legs   [] Varicose veins   [] Non-healing ulcers Pulmonary:   [] Uses home oxygen   [] Productive cough   [] Hemoptysis   [] Wheeze  [] COPD   [] Asthma Neurologic:  [] Dizziness   [] Seizures   [] History of stroke   [] History of TIA  [] Aphasia   [] Vissual changes   [] Weakness or numbness in arm   [] Weakness or numbness in leg Musculoskeletal:   [] Joint swelling   [] Joint pain   [] Low back pain Hematologic:  [] Easy bruising  [] Easy bleeding   [] Hypercoagulable state   [] Anemic Gastrointestinal:   [] Diarrhea   [] Vomiting  [] Gastroesophageal reflux/heartburn   [] Difficulty swallowing. Genitourinary:  [] Chronic kidney disease   [] Difficult urination  [] Frequent urination   [] Blood in urine Skin:  [] Rashes   [] Ulcers  Psychological:  [] History of anxiety   []  History of major depression.  Physical Examination  There were no vitals filed for this visit. There is no height or weight on file to calculate BMI. Gen: WD/WN, NAD Head: [place]/AT, No temporalis wasting.  Ear/Nose/Throat: Hearing grossly intact, nares w/o erythema or drainage Eyes: PER, EOMI, sclera nonicteric.  Neck: Supple, no masses.  No bruit or JVD.  Pulmonary:  Good air movement, no audible wheezing, no use of accessory muscles.  Cardiac: RRR, normal S1, S2, no Murmurs. Vascular:  mild trophic changes, no open wounds Vessel Right Left  Radial Palpable Palpable  PT Trace  Palpable Trace  Palpable  DP Not Palpable Not Palpable  Gastrointestinal: soft, non-distended. No guarding/no peritoneal signs.  Musculoskeletal: M/S 5/5 throughout.  No visible deformity.  Neurologic: CN 2-12 intact. Pain and light touch intact in extremities.  Symmetrical.  Speech is fluent. Motor exam as listed above. Psychiatric: Judgment intact, Mood & affect appropriate for pt's clinical situation. Dermatologic: No rashes or ulcers noted.  No changes consistent with cellulitis.   CBC Lab Results  Component Value Date   WBC 13.9 (H) 07/25/2017   HGB 13.1 07/25/2017   HCT 40.0 07/25/2017   MCV 80.9 07/25/2017   PLT 170 07/25/2017    BMET    Component Value Date/Time   NA 139 07/25/2017 0551   K 3.8 07/25/2017 0551   CL 109 07/25/2017 0551   CO2 22 07/25/2017 0551   GLUCOSE 97 07/25/2017 0551   BUN 11 07/25/2017 0551   CREATININE 0.62 07/25/2017 0551   CREATININE 0.98 04/02/2012 0848   CALCIUM 9.0 07/25/2017 0551   GFRNONAA >60 07/25/2017 0551   GFRNONAA >60 04/02/2012 0848   GFRAA >60 07/25/2017 0551   GFRAA >60 04/02/2012 0848    CrCl cannot be calculated (Patient's most recent lab result is older than the maximum 21 days allowed.).  COAG Lab Results  Component Value Date   INR 0.93 07/18/2017    Radiology No results found.   Assessment/Plan 1. 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  He is status post stent graft placement on 07/24/2017.  Procedure: Placement of a 28 x 12 x 16 C3 Gore Excluder Endoprosthesis main body with a 12 x 12 contralateral limb   No further intervention is required at this time.   No endoleak is detected but the aneurysm sac did grow somewhat.  If there is further growth noted with the next study then a CT will be obtained.   The  patient will continue antiplatelet therapy as prescribed as well as aggressive management of hyperlipidemia. Exercise is again strongly encouraged.   However, endografts require continued surveillance with ultrasound or CT scan. This is mandatory to detect any changes that allow repressurization of the aneurysm sac.  The patient is informed that this would be asymptomatic.   The patient is reminded that lifelong routine surveillance is a necessity with an endograft. Patient will continue to follow-up at 12 month intervals with ultrasound of the  - VAS US AORTA/IVC/ILIACS; Future  2. PAD (peripheral artery disease) (HCC)  Recommend:  The patient has evidence of atherosclerosis of the lower extremities with claudication.  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  Noninvasive studies do not suggest clinically significant change.  No invasive studies, angiography or surgery at this time The patient should continue walking and begin a more formal exercise program.  The patient should continue antiplatelet therapy and aggressive  treatment of the lipid abnormalities  No changes in the patient's medications at this time  Continued surveillance is indicated as atherosclerosis is likely to progress with time.    The patient will continue follow up with noninvasive studies as ordered.  - VAS [place] ABI WITH/WO TBI; Future  3. Pulmonary emphysema, unspecified emphysema type (HCC) Continue pulmonary medications and aerosols as already ordered, these medications have been reviewed and there are no changes at this time.   4. Hyperlipidemia, unspecified hyperlipidemia type Continue statin as ordered and reviewed, no changes at this time    Levora Dredge, MD  01/07/2022 2:41 PM

## 2022-01-08 ENCOUNTER — Ambulatory Visit (INDEPENDENT_AMBULATORY_CARE_PROVIDER_SITE_OTHER): Payer: Medicare HMO

## 2022-01-08 ENCOUNTER — Ambulatory Visit (INDEPENDENT_AMBULATORY_CARE_PROVIDER_SITE_OTHER): Payer: Medicare HMO | Admitting: Vascular Surgery

## 2022-01-08 ENCOUNTER — Encounter (INDEPENDENT_AMBULATORY_CARE_PROVIDER_SITE_OTHER): Payer: Self-pay | Admitting: Vascular Surgery

## 2022-01-08 VITALS — BP 157/74 | HR 60 | Resp 16 | Wt 167.4 lb

## 2022-01-08 DIAGNOSIS — I739 Peripheral vascular disease, unspecified: Secondary | ICD-10-CM | POA: Diagnosis not present

## 2022-01-08 DIAGNOSIS — I7143 Infrarenal abdominal aortic aneurysm, without rupture: Secondary | ICD-10-CM | POA: Diagnosis not present

## 2022-01-08 DIAGNOSIS — E785 Hyperlipidemia, unspecified: Secondary | ICD-10-CM | POA: Diagnosis not present

## 2022-01-08 DIAGNOSIS — M79605 Pain in left leg: Secondary | ICD-10-CM | POA: Diagnosis not present

## 2022-01-08 DIAGNOSIS — J439 Emphysema, unspecified: Secondary | ICD-10-CM

## 2022-01-08 DIAGNOSIS — M79604 Pain in right leg: Secondary | ICD-10-CM | POA: Diagnosis not present

## 2022-01-14 ENCOUNTER — Encounter (INDEPENDENT_AMBULATORY_CARE_PROVIDER_SITE_OTHER): Payer: Self-pay | Admitting: Vascular Surgery

## 2022-01-14 DIAGNOSIS — I739 Peripheral vascular disease, unspecified: Secondary | ICD-10-CM | POA: Insufficient documentation

## 2023-01-13 NOTE — Progress Notes (Unsigned)
MRN : [id number]  Jason Reese is a 82 y.o. ([birth year]) male who presents with chief complaint of check circulation.  History of Present Illness:   The patient returns to the office for surveillance of an abdominal aortic aneurysm status post stent graft placement on 07/24/2017.   Procedure: Placement of a 28 x 12 x 16 C3 Gore Excluder Endoprosthesis main body with a 12 x 12 contralateral limb   Patient denies abdominal pain or back pain, no other abdominal complaints. No groin related complaints. No symptoms consistent with distal embolization No changes in claudication distance.   There have been no interval changes in his overall healthcare since his last visit.   Patient denies amaurosis fugax or TIA symptoms. There is no history of claudication or rest pain symptoms of the lower extremities. The patient denies angina or shortness of breath.   Duplex US of the aorta and iliac arteries shows a 3.2 cm sac (previously 4.1 cm AAA sac) with no endoleak, decrease in the sac diameter compared to the previous study.   ABI's Rt=1.15 and Lt=1.25 (triphasic PT artery signals bilaterally.  No outpatient medications have been marked as taking for the 01/14/23 encounter (Appointment) with Gilda Crease, Latina Craver, MD.    Past Medical History:  Diagnosis Date   Aneurysm (HCC)    Cataract    Glaucoma    Hard of hearing    History of kidney stones    Hyperlipidemia    PONV (postoperative nausea and vomiting)    Wears hearing aid in both ears     Past Surgical History:  Procedure Laterality Date   CATARACT EXTRACTION W/PHACO Right 05/16/2020   Procedure: CATARACT EXTRACTION PHACO AND INTRAOCULAR LENS PLACEMENT (IOC) RIGHT;  Surgeon: Nevada Crane, MD;  Location: [facility];  Service: Ophthalmology;  Laterality: Right;  9.39 0:58.0   CATARACT EXTRACTION W/PHACO Left 05/30/2020   Procedure: CATARACT EXTRACTION  PHACO AND INTRAOCULAR LENS PLACEMENT (IOC) LEFT;  Surgeon: Nevada Crane, MD;  Location: [facility];  Service: Ophthalmology;  Laterality: Left;  9.04 0:58.7   ENDOVASCULAR REPAIR/STENT GRAFT N/A 07/24/2017   Procedure: ENDOVASCULAR REPAIR/STENT GRAFT;  Surgeon: Annice Needy, MD;  Location: [facility];  Service: Cardiovascular;  Laterality: N/A;   EYE SURGERY  2013   INGUINAL HERNIA REPAIR Right 08/25/2014   Procedure: RIGHT INGUINAL HERNIA REPAIR WITH MESH ;  Surgeon: Ida Rogue, MD;  Location: [facility];  Service: General;  Laterality: Right;   INGUINAL HERNIA REPAIR Left 1980   x 2- Dr. Alan Mulder   INGUINAL HERNIA REPAIR Right    JOINT REPLACEMENT Left 2007   shoulder   SHOULDER ARTHROSCOPY Left     Social History Social History   Tobacco Use   Smoking status: Former    Current packs/day: 0.00    Types: Cigarettes    Quit date: 07/28/2005    Years since quitting: 17.4   Smokeless tobacco: Never  Vaping Use   Vaping status: Never Used  Substance Use Topics   Alcohol use: No   Drug use: No    Family  History Family History  Problem Relation Age of Onset   Diabetes Mother     No Known Allergies   REVIEW OF SYSTEMS (Negative unless checked)  Constitutional: [] Weight loss  [] Fever  [] Chills Cardiac: [] Chest pain   [] Chest pressure   [] Palpitations   [] Shortness of breath when laying flat   [] Shortness of breath with exertion. Vascular:  [x] Pain in legs with walking   [] Pain in legs at rest  [] History of DVT   [] Phlebitis   [] Swelling in legs   [] Varicose veins   [] Non-healing ulcers Pulmonary:   [] Uses home oxygen   [] Productive cough   [] Hemoptysis   [] Wheeze  [] COPD   [] Asthma Neurologic:  [] Dizziness   [] Seizures   [] History of stroke   [] History of TIA  [] Aphasia   [] Vissual changes   [] Weakness or numbness in arm   [] Weakness or numbness in leg Musculoskeletal:   [] Joint swelling   [] Joint pain   [] Low back pain Hematologic:  [] Easy  bruising  [] Easy bleeding   [] Hypercoagulable state   [] Anemic Gastrointestinal:  [] Diarrhea   [] Vomiting  [] Gastroesophageal reflux/heartburn   [] Difficulty swallowing. Genitourinary:  [] Chronic kidney disease   [] Difficult urination  [] Frequent urination   [] Blood in urine Skin:  [] Rashes   [] Ulcers  Psychological:  [] History of anxiety   []  History of major depression.  Physical Examination  There were no vitals filed for this visit. There is no height or weight on file to calculate BMI. Gen: WD/WN, NAD Head: [place]/AT, No temporalis wasting.  Ear/Nose/Throat: Hearing grossly intact, nares w/o erythema or drainage Eyes: PER, EOMI, sclera nonicteric.  Neck: Supple, no masses.  No bruit or JVD.  Pulmonary:  Good air movement, no audible wheezing, no use of accessory muscles.  Cardiac: RRR, normal S1, S2, no Murmurs. Vascular:  mild trophic changes, no open wounds Vessel Right Left  Radial Palpable Palpable  PT Not Palpable Not Palpable  DP Not Palpable Not Palpable  Gastrointestinal: soft, non-distended. No guarding/no peritoneal signs.  Musculoskeletal: M/S 5/5 throughout.  No visible deformity.  Neurologic: CN 2-12 intact. Pain and light touch intact in extremities.  Symmetrical.  Speech is fluent. Motor exam as listed above. Psychiatric: Judgment intact, Mood & affect appropriate for pt's clinical situation. Dermatologic: No rashes or ulcers noted.  No changes consistent with cellulitis.   CBC Lab Results  Component Value Date   WBC 13.9 (H) 07/25/2017   HGB 13.1 07/25/2017   HCT 40.0 07/25/2017   MCV 80.9 07/25/2017   PLT 170 07/25/2017    BMET    Component Value Date/Time   NA 139 07/25/2017 0551   K 3.8 07/25/2017 0551   CL 109 07/25/2017 0551   CO2 22 07/25/2017 0551   GLUCOSE 97 07/25/2017 0551   BUN 11 07/25/2017 0551   CREATININE 0.62 07/25/2017 0551   CREATININE 0.98 04/02/2012 0848   CALCIUM 9.0 07/25/2017 0551   GFRNONAA >60 07/25/2017 0551   GFRNONAA >60  04/02/2012 0848   GFRAA >60 07/25/2017 0551   GFRAA >60 04/02/2012 0848   CrCl cannot be calculated (Patient's most recent lab result is older than the maximum 21 days allowed.).  COAG Lab Results  Component Value Date   INR 0.93 07/18/2017    Radiology No results found.   Assessment/Plan There are no diagnoses linked to this encounter.   Levora Dredge, MD  01/13/2023 10:52 AM

## 2023-01-14 ENCOUNTER — Encounter (INDEPENDENT_AMBULATORY_CARE_PROVIDER_SITE_OTHER): Payer: Self-pay | Admitting: Vascular Surgery

## 2023-01-14 ENCOUNTER — Ambulatory Visit (INDEPENDENT_AMBULATORY_CARE_PROVIDER_SITE_OTHER): Payer: Medicare HMO | Admitting: Vascular Surgery

## 2023-01-14 ENCOUNTER — Ambulatory Visit (INDEPENDENT_AMBULATORY_CARE_PROVIDER_SITE_OTHER): Payer: Medicare HMO

## 2023-01-14 VITALS — BP 158/87 | HR 72 | Resp 15 | Wt 169.4 lb

## 2023-01-14 DIAGNOSIS — I739 Peripheral vascular disease, unspecified: Secondary | ICD-10-CM

## 2023-01-14 DIAGNOSIS — J439 Emphysema, unspecified: Secondary | ICD-10-CM | POA: Diagnosis not present

## 2023-01-14 DIAGNOSIS — I7143 Infrarenal abdominal aortic aneurysm, without rupture: Secondary | ICD-10-CM

## 2023-01-14 DIAGNOSIS — E785 Hyperlipidemia, unspecified: Secondary | ICD-10-CM

## 2023-01-14 LAB — VAS US ABI WITH/WO TBI
Left ABI: 1.23
Right ABI: 1.18

## 2023-10-02 ENCOUNTER — Other Ambulatory Visit: Payer: Self-pay

## 2023-10-02 ENCOUNTER — Inpatient Hospital Stay
Admission: EM | Admit: 2023-10-02 | Discharge: 2023-10-05 | DRG: 872 | Disposition: A | Discharge status: Home / Self Care | Attending: Internal Medicine | Admitting: Internal Medicine

## 2023-10-02 ENCOUNTER — Emergency Department

## 2023-10-02 DIAGNOSIS — I739 Peripheral vascular disease, unspecified: Secondary | ICD-10-CM | POA: Diagnosis present

## 2023-10-02 DIAGNOSIS — N390 Urinary tract infection, site not specified: Principal | ICD-10-CM | POA: Diagnosis present

## 2023-10-02 DIAGNOSIS — N400 Enlarged prostate without lower urinary tract symptoms: Secondary | ICD-10-CM | POA: Diagnosis present

## 2023-10-02 DIAGNOSIS — Z7982 Long term (current) use of aspirin: Secondary | ICD-10-CM

## 2023-10-02 DIAGNOSIS — E785 Hyperlipidemia, unspecified: Secondary | ICD-10-CM | POA: Diagnosis present

## 2023-10-02 DIAGNOSIS — R319 Hematuria, unspecified: Secondary | ICD-10-CM | POA: Diagnosis present

## 2023-10-02 DIAGNOSIS — R0902 Hypoxemia: Secondary | ICD-10-CM | POA: Diagnosis present

## 2023-10-02 DIAGNOSIS — J9811 Atelectasis: Secondary | ICD-10-CM | POA: Diagnosis present

## 2023-10-02 DIAGNOSIS — Z833 Family history of diabetes mellitus: Secondary | ICD-10-CM

## 2023-10-02 DIAGNOSIS — J9601 Acute respiratory failure with hypoxia: Secondary | ICD-10-CM

## 2023-10-02 DIAGNOSIS — A419 Sepsis, unspecified organism: Secondary | ICD-10-CM | POA: Diagnosis not present

## 2023-10-02 DIAGNOSIS — Z79899 Other long term (current) drug therapy: Secondary | ICD-10-CM

## 2023-10-02 DIAGNOSIS — R0603 Acute respiratory distress: Secondary | ICD-10-CM | POA: Diagnosis not present

## 2023-10-02 DIAGNOSIS — H409 Unspecified glaucoma: Secondary | ICD-10-CM | POA: Diagnosis present

## 2023-10-02 DIAGNOSIS — J189 Pneumonia, unspecified organism: Secondary | ICD-10-CM

## 2023-10-02 DIAGNOSIS — Z1152 Encounter for screening for COVID-19: Secondary | ICD-10-CM

## 2023-10-02 DIAGNOSIS — Z974 Presence of external hearing-aid: Secondary | ICD-10-CM

## 2023-10-02 DIAGNOSIS — J439 Emphysema, unspecified: Secondary | ICD-10-CM | POA: Diagnosis present

## 2023-10-02 DIAGNOSIS — Z87891 Personal history of nicotine dependence: Secondary | ICD-10-CM

## 2023-10-02 MED ORDER — ONDANSETRON HCL 4 MG/2ML IJ SOLN
4.0000 mg | Freq: Once | INTRAMUSCULAR | Status: AC
  Administered 2023-10-02: 4 mg via INTRAVENOUS
  Filled 2023-10-02: qty 2

## 2023-10-02 MED ORDER — ACETAMINOPHEN 500 MG PO TABS
1000.0000 mg | ORAL_TABLET | Freq: Once | ORAL | Status: AC
  Administered 2023-10-03: 1000 mg via ORAL
  Filled 2023-10-02: qty 2

## 2023-10-02 MED ORDER — LACTATED RINGERS IV BOLUS
1000.0000 mL | Freq: Once | INTRAVENOUS | Status: AC
  Administered 2023-10-03: 1000 mL via INTRAVENOUS

## 2023-10-02 NOTE — ED Triage Notes (Signed)
 Patient C/O vomiting that began about two hours ago. Additionally, patient was on the toilet when he was shaking and fell. Patient has hematoma to right forehead.

## 2023-10-02 NOTE — ED Provider Notes (Signed)
 [facility] Provider Note   None    (approximate) History  Emesis and Fall  HPI Jason Reese is a 83 y.o. male with a past medical history of hyperlipidemia, emphysema, peripheral arterial disease, and aortic aneurysm who presents complaining of vomiting that began 2 hours prior to arrival with associated fever.  Patient fell from the toilet today and struck his head without any loss of consciousness.  Patient states that he fell as he was having rigors and could not get his balance.  Patient has not taken any medications and no medications were given by EMS ROS: Patient currently denies any vision changes, tinnitus, difficulty speaking, facial droop, sore throat, chest pain, shortness of breath, abdominal pain, diarrhea, dysuria, or weakness/numbness/paresthesias in any extremity   Physical Exam  Triage Vital Signs: ED Triage Vitals  Encounter Vitals Group     BP      Girls Systolic BP Percentile      Girls Diastolic BP Percentile      Boys Systolic BP Percentile      Boys Diastolic BP Percentile      Pulse      Resp      Temp      Temp src      SpO2      Weight      Height      Head Circumference      Peak Flow      Pain Score      Pain Loc      Pain Education      Exclude from Growth Chart    Most recent vital signs: Vitals:   10/03/23 0602 10/03/23 0607  BP:    Pulse: 84 83  Resp: (!) 21 18  Temp:    SpO2: (!) 89% 94%   General: Awake, oriented x4. CV:  Good peripheral perfusion. Resp:  Normal effort. Abd:  No distention. Other:  Elderly well-developed, well-nourished Caucasian male resting comfortably in no acute distress.  Ecchymosis, hematoma to right forehead that is 3 cm x 6 cm ED Results / Procedures / Treatments  Labs (all labs ordered are listed, but only abnormal results are displayed) Labs Reviewed  COMPREHENSIVE METABOLIC PANEL WITH GFR - Abnormal; Notable for the following components:      Result Value   CO2 21 (*)     Glucose, Bld 139 (*)    All other components within normal limits  CBC WITH DIFFERENTIAL/PLATELET - Abnormal; Notable for the following components:   WBC 11.4 (*)    Platelets 133 (*)    Neutro Abs 10.7 (*)    Lymphs Abs 0.3 (*)    All other components within normal limits  URINALYSIS, ROUTINE W REFLEX MICROSCOPIC - Abnormal; Notable for the following components:   Color, Urine YELLOW (*)    APPearance CLOUDY (*)    Hgb urine dipstick LARGE (*)    Leukocytes,Ua MODERATE (*)    Bacteria, UA MANY (*)    All other components within normal limits  RESP PANEL BY RT-PCR (RSV, FLU A&B, COVID)  [id number]  LIPASE, BLOOD  LACTIC ACID, PLASMA  LACTIC ACID, PLASMA   EKG ED ECG REPORT I, Artist MARLA Kerns, the attending physician, personally viewed and interpreted this ECG. Date: 10/02/2023 EKG Time: 2347 Rate: 115 Rhythm: Tachycardic sinus rhythm QRS Axis: normal Intervals: normal ST/T Wave abnormalities: normal Narrative Interpretation: Tachycardic sinus rhythm. No evidence of acute ischemia RADIOLOGY ED MD interpretation: CT of the abdomen and  pelvis with IV contrast shows no acute findings in the abdomen or pelvis with an intermediate sized hiatal hernia Single view chest x-ray shows patchy opacity in the left greater than right lung bases CT of the head without contrast interpreted by me shows no evidence of acute abnormalities including no intracerebral hemorrhage, obvious masses, or significant edema CT of the cervical spine interpreted by me does not show any evidence of acute abnormalities including no acute fracture, malalignment, height loss, or dislocation - All radiology independently interpreted and agree with radiology assessment Official radiology report(s): CT ABDOMEN PELVIS W CONTRAST Result Date: 10/03/2023 EXAM: CT ABDOMEN AND PELVIS WITH CONTRAST 10/03/2023 03:32:59 AM TECHNIQUE: CT of the abdomen and pelvis was performed with the administration of intravenous contrast.  Multiplanar reformatted images are provided for review. Automated exposure control, iterative reconstruction, and/or weight-based adjustment of the mA/kV was utilized to reduce the radiation dose to as low as reasonably achievable. CONTRAST: 100mL iohexol  (OMNIPAQUE ) 300 MG/ML solution COMPARISON: 06/21/2017 CLINICAL HISTORY: Abdominal pain, acute, nonlocalized; n/v. Table formatting from the original note was not included.; Images from the original note were not included.; Patient C/O vomiting that began about two hours ago. Additionally, patient was on the toilet when he was shaking and fell. Patient has hematoma to right forehead. FINDINGS: LOWER CHEST: Bibasilar atelectasis. LIVER: Multiple small hypodensities within the liver most compatible with hepatic cysts. GALLBLADDER AND BILE DUCTS: No wall thickening. No cholelithiasis. No biliary ductal dilatation. SPLEEN: Normal size. No focal lesion. PANCREAS: No mass. No ductal dilatation. ADRENAL GLANDS: Normal appearance. No mass. KIDNEYS, URETERS AND BLADDER: Multiple bilateral nonobstructing renal calculi measuring up to 14 mm. No hydronephrosis. No perinephric or periureteral stranding. Urinary bladder is unremarkable. GI AND BOWEL: Intermediate-sized hiatal hernia. Diverticulosis of the sigmoid colon without acute inflammation. There is no bowel obstruction. No bowel wall thickening. PERITONEUM AND RETROPERITONEUM: No ascites. No free air. VASCULATURE: Aorta is normal in caliber. Aortobiiliac stent is patent. LYMPH NODES: No lymphadenopathy. REPRODUCTIVE ORGANS: No significant abnormality. BONES AND SOFT TISSUES: No acute osseous abnormality. No focal soft tissue abnormality. IMPRESSION: 1. No acute findings in the abdomen or pelvis. 2. Intermediate-sized hiatal hernia. Electronically signed by: Franky Stanford MD 10/03/2023 04:05 AM EDT RP Workstation: [id number]   DG Chest Port 1 View Result Date: 10/03/2023 CLINICAL DATA:  Shortness of breath with hypoxia.  EXAM: PORTABLE CHEST 1 VIEW COMPARISON:  PA Lat chest 08/18/2014. FINDINGS: There is a low inspiration. There is patchy opacity in the left-greater-than-right lung bases, which could be due to atelectasis from low lung volumes or could be due to pneumonia. The heart is enlarged. No vascular congestion is seen allowing for low inspiration. The aorta is tortuous and calcified with stable mediastinum. There is no substantial pleural effusion. 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 No new osseous finding. IMPRESSION: 1. Low inspiration with patchy opacity in the left-greater-than-right lung bases, which could be due to atelectasis from low lung volumes or could be due to pneumonia or aspiration. A follow-up study is recommended in full inspiration. 2. Cardiomegaly. 3. Aortic atherosclerosis. Electronically Signed   By: Francis Quam M.D.   On: 10/03/2023 01:36   CT Head Wo Contrast Result Date: 10/03/2023 CLINICAL DATA:  Neck trauma (Age >= 65y); Head trauma, minor (Age >= 65y), vomiting, fall EXAM: CT HEAD WITHOUT CONTRAST CT CERVICAL SPINE WITHOUT CONTRAST TECHNIQUE: Multidetector CT imaging of the head and cervical spine was performed following the standard protocol without intravenous contrast. Multiplanar  CT image reconstructions of the cervical spine were also generated. RADIATION DOSE REDUCTION: This exam was performed according to the departmental dose-optimization program which includes automated exposure control, adjustment of the mA and/or kV according to patient size and/or use of iterative reconstruction technique. COMPARISON:  None Available. FINDINGS: CT HEAD FINDINGS Brain: Normal anatomic configuration. Parenchymal volume loss is commensurate with the patient's age. Mild periventricular white matter changes are present likely reflecting the sequela of small vessel ischemia. 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  Tiny remote lacunar infarct within the left pons. No abnormal intra or extra-axial mass lesion or fluid collection. No abnormal mass effect or midline shift. No evidence of acute intracranial hemorrhage or infarct. Ventricular size is normal. Cerebellum unremarkable. Vascular: No asymmetric hyperdense vasculature at the skull base. Skull: Intact Sinuses/Orbits: Paranasal sinuses are clear. Orbits are unremarkable. Other: Mastoid air cells and middle ear cavities are clear. Moderate right frontal scalp hematoma. CT CERVICAL SPINE FINDINGS Alignment: 2 mm retrolisthesis C3-4 and C4-5. Skull base and vertebrae: Craniocervical alignment is normal. The atlantodental interval is not widened. No acute fracture of the cervical spine Soft tissues and spinal canal: No prevertebral fluid or swelling. No visible canal hematoma. Disc levels: Disc space narrowing, endplate remodeling, subchondral cyst formation noted throughout the cervical spine in keeping with changes of advanced degenerative disc disease, most severe at C3-4 and C5-6. Posterior disc osteophyte complex at C3-4 in combination with retrolisthesis results in moderate central canal stenosis with an AP diameter of the spinal canal approximately 5-6 mm and flattening of the thecal sac. Milder changes are seen at C4-5. Multilevel uncovertebral and facet arthrosis results in multilevel moderate to severe neuroforaminal narrowing most severe on the right at C4-5 and C5-6 and the left at C3-4 C4-5 and C5-6. Upper chest: Severe emphysema Other: Aneurysm the thoracic aorta, incompletely evaluated on this examination measuring at least 3.9 cm at the arch. IMPRESSION: 1. No acute intracranial abnormality. No calvarial fracture. Moderate right frontal scalp hematoma. 2. No acute fracture or listhesis of the cervical spine. 3. 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 4. Aneurysm of the thoracic aorta, incompletely evaluated on this examination measuring at least 3.9 cm at the arch. If indicated, this would be better assessed with dedicated CT arterial 5. Emphysema. Emphysema (ICD10-J43.9). Electronically Signed   By: Dorethia Molt M.D.   On: 10/03/2023 00:32   CT Cervical Spine Wo Contrast Result Date: 10/03/2023 CLINICAL DATA:  Neck trauma (Age >= 65y); Head trauma, minor (Age >= 65y), vomiting, fall EXAM: CT HEAD WITHOUT CONTRAST CT CERVICAL SPINE WITHOUT CONTRAST TECHNIQUE: Multidetector CT imaging of the head and cervical spine was performed following the standard protocol without intravenous contrast. Multiplanar CT image reconstructions of the cervical spine were also generated. RADIATION DOSE REDUCTION: This exam was performed according to the departmental dose-optimization program which includes automated exposure control, adjustment of the mA and/or kV according to patient size and/or use of iterative reconstruction technique. COMPARISON:  None Available. FINDINGS: CT HEAD FINDINGS Brain: Normal anatomic configuration. Parenchymal volume loss is commensurate with the patient's age. Mild periventricular white matter changes are present likely reflecting the sequela of small vessel ischemia. 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 Tiny remote lacunar infarct within the left pons. No abnormal intra or extra-axial mass lesion or fluid collection.  No abnormal mass effect or midline shift. No evidence of acute intracranial hemorrhage or infarct. Ventricular size is normal. Cerebellum unremarkable. Vascular: No asymmetric hyperdense vasculature at the skull base. Skull: Intact Sinuses/Orbits: Paranasal sinuses are clear. Orbits are unremarkable. Other: Mastoid air cells and middle ear cavities are clear. Moderate right frontal scalp hematoma. CT CERVICAL SPINE FINDINGS Alignment: 2 mm  retrolisthesis C3-4 and C4-5. Skull base and vertebrae: Craniocervical alignment is normal. The atlantodental interval is not widened. No acute fracture of the cervical spine Soft tissues and spinal canal: No prevertebral fluid or swelling. No visible canal hematoma. Disc levels: Disc space narrowing, endplate remodeling, subchondral cyst formation noted throughout the cervical spine in keeping with changes of advanced degenerative disc disease, most severe at C3-4 and C5-6. Posterior disc osteophyte complex at C3-4 in combination with retrolisthesis results in moderate central canal stenosis with an AP diameter of the spinal canal approximately 5-6 mm and flattening of the thecal sac. Milder changes are seen at C4-5. Multilevel uncovertebral and facet arthrosis results in multilevel moderate to severe neuroforaminal narrowing most severe on the right at C4-5 and C5-6 and the left at C3-4 C4-5 and C5-6. Upper chest: Severe emphysema Other: Aneurysm the thoracic aorta, incompletely evaluated on this examination measuring at least 3.9 cm at the arch. IMPRESSION: 1. No acute intracranial abnormality. No calvarial fracture. Moderate right frontal scalp hematoma. 2. No acute fracture or listhesis of the cervical spine. 3. 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 4. Aneurysm of the thoracic aorta, incompletely evaluated on this examination measuring at least 3.9 cm at the arch. If indicated, this would be better assessed with dedicated CT arterial 5. Emphysema. Emphysema (ICD10-J43.9). Electronically Signed   By: Dorethia Molt M.D.   On: 10/03/2023 00:32   PROCEDURES: Critical Care performed: Yes, see critical care procedure note(s) Procedures MEDICATIONS ORDERED IN ED: Medications  ondansetron  (ZOFRAN ) injection 4 mg (4 mg Intravenous Given 10/02/23 2355)  lactated  ringers  bolus 1,000 mL (0 mLs Intravenous Stopped 10/03/23 0230)  acetaminophen  (TYLENOL ) tablet 1,000 mg (1,000 mg Oral Given 10/03/23 0007)  iohexol  (OMNIPAQUE ) 300 MG/ML solution 100 mL (100 mLs Intravenous Contrast Given 10/03/23 0319)  ondansetron  (ZOFRAN ) injection 4 mg (4 mg Intravenous Given 10/03/23 0352)  cefTRIAXone  (ROCEPHIN ) 1 g in sodium chloride  0.9 % 100 mL IVPB (0 g Intravenous Stopped 10/03/23 0520)  azithromycin  (ZITHROMAX ) 500 mg in sodium chloride  0.9 % 250 mL IVPB (500 mg Intravenous New Bag/Given 10/03/23 0524)   IMPRESSION / MDM / ASSESSMENT AND PLAN / ED COURSE  I reviewed the triage vital signs and the nursing notes.                             The patient is on the cardiac monitor to evaluate for evidence of arrhythmia and/or significant heart rate changes. Patient's presentation is most consistent with acute presentation with potential threat to life or bodily function. Patient is a 83 year old male with the above-stated past medical history who presents for vomiting and fever DDx: Appendicitis, biliary disease, UTI, pneumonia, sepsis Plan: Initiate empiric septic workup Empiric antibiotics: Ceftriaxone , azithromycin  IVF: LR Imaging: CT head, C-spine, abdomen/pelvis Chest x-ray  Urinalysis positive for moderate leukocytes and many bacteria, patient leukocytosis to 11.4, RVP negative, chest x-ray showing possible pneumonia  Patient also had significant respiratory distress throughout his emergency department  course with respiratory rate in the 30s as well as hypoxia to 90 and patient was placed on nasal cannula with improvement  Consults: I spoke to Dr. Lawence with hospitalist service who was agreed to except this patient for further evaluation and management  Dispo: Admit to medicine   FINAL CLINICAL IMPRESSION(S) / ED DIAGNOSES   Final diagnoses:  Urinary tract infection with hematuria, site unspecified  Multifocal pneumonia  Acute hypoxic respiratory failure (HCC)    Rx / DC Orders   ED Discharge Orders     None      Note:  This document was prepared using Dragon voice recognition software and may include unintentional dictation errors.   Shreyas Piatkowski K, MD 10/03/23 [phone]

## 2023-10-03 ENCOUNTER — Emergency Department

## 2023-10-03 DIAGNOSIS — R0902 Hypoxemia: Secondary | ICD-10-CM | POA: Diagnosis present

## 2023-10-03 DIAGNOSIS — A419 Sepsis, unspecified organism: Secondary | ICD-10-CM

## 2023-10-03 DIAGNOSIS — Z833 Family history of diabetes mellitus: Secondary | ICD-10-CM | POA: Diagnosis not present

## 2023-10-03 DIAGNOSIS — Z79899 Other long term (current) drug therapy: Secondary | ICD-10-CM | POA: Diagnosis not present

## 2023-10-03 DIAGNOSIS — H409 Unspecified glaucoma: Secondary | ICD-10-CM | POA: Diagnosis present

## 2023-10-03 DIAGNOSIS — J189 Pneumonia, unspecified organism: Secondary | ICD-10-CM | POA: Insufficient documentation

## 2023-10-03 DIAGNOSIS — J439 Emphysema, unspecified: Secondary | ICD-10-CM | POA: Diagnosis present

## 2023-10-03 DIAGNOSIS — N400 Enlarged prostate without lower urinary tract symptoms: Secondary | ICD-10-CM | POA: Diagnosis present

## 2023-10-03 DIAGNOSIS — Z87891 Personal history of nicotine dependence: Secondary | ICD-10-CM | POA: Diagnosis not present

## 2023-10-03 DIAGNOSIS — R319 Hematuria, unspecified: Secondary | ICD-10-CM | POA: Diagnosis present

## 2023-10-03 DIAGNOSIS — Z1152 Encounter for screening for COVID-19: Secondary | ICD-10-CM | POA: Diagnosis not present

## 2023-10-03 DIAGNOSIS — R0603 Acute respiratory distress: Secondary | ICD-10-CM | POA: Diagnosis present

## 2023-10-03 DIAGNOSIS — N390 Urinary tract infection, site not specified: Secondary | ICD-10-CM | POA: Diagnosis present

## 2023-10-03 DIAGNOSIS — E785 Hyperlipidemia, unspecified: Secondary | ICD-10-CM | POA: Diagnosis present

## 2023-10-03 DIAGNOSIS — Z7982 Long term (current) use of aspirin: Secondary | ICD-10-CM | POA: Diagnosis not present

## 2023-10-03 DIAGNOSIS — Z974 Presence of external hearing-aid: Secondary | ICD-10-CM | POA: Diagnosis not present

## 2023-10-03 DIAGNOSIS — J9811 Atelectasis: Secondary | ICD-10-CM | POA: Diagnosis present

## 2023-10-03 DIAGNOSIS — I739 Peripheral vascular disease, unspecified: Secondary | ICD-10-CM | POA: Diagnosis present

## 2023-10-03 LAB — CBC WITH DIFFERENTIAL/PLATELET
Abs Immature Granulocytes: 0.05 K/uL (ref 0.00–0.07)
Basophils Absolute: 0.1 K/uL (ref 0.0–0.1)
Basophils Relative: 1 %
Eosinophils Absolute: 0.2 K/uL (ref 0.0–0.5)
Eosinophils Relative: 2 %
HCT: 45.4 % (ref 39.0–52.0)
Hemoglobin: 15 g/dL (ref 13.0–17.0)
Immature Granulocytes: 0 %
Lymphocytes Relative: 3 %
Lymphs Abs: 0.3 K/uL — ABNORMAL LOW (ref 0.7–4.0)
MCH: 29.5 pg (ref 26.0–34.0)
MCHC: 33 g/dL (ref 30.0–36.0)
MCV: 89.4 fL (ref 80.0–100.0)
Monocytes Absolute: 0.1 K/uL (ref 0.1–1.0)
Monocytes Relative: 1 %
Neutro Abs: 10.7 K/uL — ABNORMAL HIGH (ref 1.7–7.7)
Neutrophils Relative %: 93 %
Platelets: 133 K/uL — ABNORMAL LOW (ref 150–400)
RBC: 5.08 MIL/uL (ref 4.22–5.81)
RDW: 13.5 % (ref 11.5–15.5)
WBC: 11.4 K/uL — ABNORMAL HIGH (ref 4.0–10.5)
nRBC: 0 % (ref 0.0–0.2)

## 2023-10-03 LAB — COMPREHENSIVE METABOLIC PANEL WITH GFR
ALT: 10 U/L (ref 0–44)
AST: 23 U/L (ref 15–41)
Albumin: 3.8 g/dL (ref 3.5–5.0)
Alkaline Phosphatase: 61 U/L (ref 38–126)
Anion gap: 15 (ref 5–15)
BUN: 19 mg/dL (ref 8–23)
CO2: 21 mmol/L — ABNORMAL LOW (ref 22–32)
Calcium: 9.2 mg/dL (ref 8.9–10.3)
Chloride: 108 mmol/L (ref 98–111)
Creatinine, Ser: 0.86 mg/dL (ref 0.61–1.24)
GFR, Estimated: >60 mL/min (ref >60)
Glucose, Bld: 139 mg/dL — ABNORMAL HIGH (ref 70–99)
Potassium: 3.7 mmol/L (ref 3.5–5.1)
Sodium: 144 mmol/L (ref 135–145)
Total Bilirubin: 0.7 mg/dL (ref 0.0–1.2)
Total Protein: 7 g/dL (ref 6.5–8.1)

## 2023-10-03 LAB — RESP PANEL BY RT-PCR (RSV, FLU A&B, COVID)  RVPGX2
Influenza A by PCR: NEGATIVE
Influenza B by PCR: NEGATIVE
Resp Syncytial Virus by PCR: NEGATIVE
SARS Coronavirus 2 by RT PCR: NEGATIVE

## 2023-10-03 LAB — URINALYSIS, ROUTINE W REFLEX MICROSCOPIC
Bilirubin Urine: NEGATIVE
Glucose, UA: NEGATIVE mg/dL
Ketones, ur: NEGATIVE mg/dL
Nitrite: NEGATIVE
Protein, ur: NEGATIVE mg/dL
RBC / HPF: >50 RBC/hpf (ref 0–5)
Specific Gravity, Urine: 1.009 (ref 1.005–1.030)
WBC, UA: >50 WBC/hpf (ref 0–5)
pH: 6 (ref 5.0–8.0)

## 2023-10-03 LAB — LACTIC ACID, PLASMA
Lactic Acid, Venous: 1.4 mmol/L (ref 0.5–1.9)
Lactic Acid, Venous: 1.9 mmol/L (ref 0.5–1.9)

## 2023-10-03 LAB — LIPASE, BLOOD: Lipase: 38 U/L (ref 11–51)

## 2023-10-03 MED ORDER — SODIUM CHLORIDE 0.9 % IV SOLN: 1.0000 g | INTRAVENOUS | Status: DC

## 2023-10-03 MED ORDER — IOHEXOL 300 MG/ML  SOLN
100.0000 mL | Freq: Once | INTRAMUSCULAR | Status: AC | PRN
  Administered 2023-10-03: 100 mL via INTRAVENOUS

## 2023-10-03 MED ORDER — ONDANSETRON HCL 4 MG/2ML IJ SOLN: 4.0000 mg | Freq: Four times a day (QID) | INTRAMUSCULAR | Status: DC | PRN

## 2023-10-03 MED ORDER — ASPIRIN 81 MG PO TBEC
81.0000 mg | DELAYED_RELEASE_TABLET | Freq: Every day | ORAL | Status: DC
  Administered 2023-10-03 – 2023-10-05 (×3): 81 mg via ORAL
  Filled 2023-10-03 (×3): qty 1

## 2023-10-03 MED ORDER — ONDANSETRON HCL 4 MG/2ML IJ SOLN
4.0000 mg | Freq: Once | INTRAMUSCULAR | Status: AC
  Administered 2023-10-03: 4 mg via INTRAVENOUS
  Filled 2023-10-03: qty 2

## 2023-10-03 MED ORDER — SODIUM CHLORIDE 0.9 % IV SOLN
1.0000 g | INTRAVENOUS | Status: DC
  Administered 2023-10-04 – 2023-10-05 (×2): 1 g via INTRAVENOUS
  Filled 2023-10-03 (×2): qty 10

## 2023-10-03 MED ORDER — ENOXAPARIN SODIUM 40 MG/0.4ML IJ SOSY
40.0000 mg | PREFILLED_SYRINGE | INTRAMUSCULAR | Status: DC
  Administered 2023-10-03 – 2023-10-04 (×2): 40 mg via SUBCUTANEOUS
  Filled 2023-10-03 (×2): qty 0.4

## 2023-10-03 MED ORDER — ACETAMINOPHEN 500 MG PO TABS
500.0000 mg | ORAL_TABLET | Freq: Four times a day (QID) | ORAL | Status: DC | PRN
  Administered 2023-10-04 – 2023-10-05 (×2): 500 mg via ORAL
  Filled 2023-10-03 (×2): qty 1

## 2023-10-03 MED ORDER — DORZOLAMIDE HCL-TIMOLOL MAL 2-0.5 % OP SOLN
1.0000 [drp] | Freq: Two times a day (BID) | OPHTHALMIC | Status: DC
  Administered 2023-10-03 – 2023-10-05 (×4): 1 [drp] via OPHTHALMIC
  Filled 2023-10-03 (×2): qty 10

## 2023-10-03 MED ORDER — SODIUM CHLORIDE 0.9 % IV SOLN
1.0000 g | Freq: Once | INTRAVENOUS | Status: AC
  Administered 2023-10-03: 1 g via INTRAVENOUS
  Filled 2023-10-03: qty 10

## 2023-10-03 MED ORDER — PRAVASTATIN SODIUM 20 MG PO TABS
20.0000 mg | ORAL_TABLET | Freq: Every day | ORAL | Status: DC
  Administered 2023-10-03 – 2023-10-04 (×2): 20 mg via ORAL
  Filled 2023-10-03 (×2): qty 1

## 2023-10-03 MED ORDER — TAMSULOSIN HCL 0.4 MG PO CAPS
0.4000 mg | ORAL_CAPSULE | Freq: Every day | ORAL | Status: DC
  Administered 2023-10-03 – 2023-10-04 (×2): 0.4 mg via ORAL
  Filled 2023-10-03 (×2): qty 1

## 2023-10-03 MED ORDER — ONDANSETRON HCL 4 MG PO TABS: 4.0000 mg | ORAL_TABLET | Freq: Four times a day (QID) | ORAL | Status: DC | PRN

## 2023-10-03 MED ORDER — SODIUM CHLORIDE 0.9 % IV SOLN
500.0000 mg | Freq: Once | INTRAVENOUS | Status: AC
  Administered 2023-10-03: 500 mg via INTRAVENOUS
  Filled 2023-10-03: qty 5

## 2023-10-03 MED ORDER — LATANOPROST 0.005 % OP SOLN
1.0000 [drp] | Freq: Every day | OPHTHALMIC | Status: DC
  Administered 2023-10-03 – 2023-10-04 (×2): 1 [drp] via OPHTHALMIC
  Filled 2023-10-03 (×2): qty 2.5

## 2023-10-03 NOTE — H&P (Signed)
 History and Physical    Jason Reese FMW:[id number] DOB: 1940-04-29 DOA: 10/02/2023  PCP: Corlis Honor [name], MD (Confirm with patient/family/NH records and if not entered, this has to be entered at [facility] point of entry) Patient coming from: Home  I have personally briefly reviewed patient's old medical records in [facility]  Chief Complaint: Feeling better  HPI: Jason Reese is a 83 y.o. male with medical history significant of AAA s/p repair, HLD, glucoma, presented with multiple complaints including fever and chills, trouble to urinate, general weakness and fall.  Patient started to feel malaise and general weakness yesterday, started to feel nauseous and vomited couple of times at home and fell and hit his forehead in the toilet while urinating.  Denied any LOC.  Denied any weakness or numbness of the limbs.  He also complained about burning sensation when urinate in the last few days.  At baseline, he has had trouble to urinate for few months, with protein streams and dripping at the end of urination.  Denied any back pain.  Family called EMS, EMS arrived and found patient had a fever of 102F temperature 102, heart rate 114 blood pressure 140/77 O2 saturation 95% on room air.  Chest x-ray showed bilateral atelectasis versus infiltrates.  CT abdomen pelvis that showed no acute infiltrates in lower lungs, no acute finding in abdomen.  UA showed 3+ WBC and 3+ RBC, blood work showed WBC 14.5 hemoglobin 15 COVID-negative BUN 19 creatinine 0.8.  Lactic acid 1.9 > 1.4.  Patient was given ceftriaxone  and azithromycin  in the ED.     Review of Systems: As per HPI otherwise 14 point review of systems negative.    Past Medical History:  Diagnosis Date   Aneurysm (HCC)    Cataract    Glaucoma    Hard of hearing    History of kidney stones    Hyperlipidemia    PONV (postoperative nausea and vomiting)    Wears hearing aid in both ears     Past Surgical History:  Procedure Laterality Date    CATARACT EXTRACTION W/PHACO Right 05/16/2020   Procedure: CATARACT EXTRACTION PHACO AND INTRAOCULAR LENS PLACEMENT (IOC) RIGHT;  Surgeon: Myrna Adine Anes, MD;  Location: [facility];  Service: Ophthalmology;  Laterality: Right;  9.39 0:58.0   CATARACT EXTRACTION W/PHACO Left 05/30/2020   Procedure: CATARACT EXTRACTION PHACO AND INTRAOCULAR LENS PLACEMENT (IOC) LEFT;  Surgeon: Myrna Adine Anes, MD;  Location: [facility];  Service: Ophthalmology;  Laterality: Left;  9.04 0:58.7   ENDOVASCULAR REPAIR/STENT GRAFT N/A 07/24/2017   Procedure: ENDOVASCULAR REPAIR/STENT GRAFT;  Surgeon: Marea Selinda RAMAN, MD;  Location: [facility];  Service: Cardiovascular;  Laterality: N/A;   EYE SURGERY  2013   INGUINAL HERNIA REPAIR Right 08/25/2014   Procedure: RIGHT INGUINAL HERNIA REPAIR WITH MESH ;  Surgeon: Lonni Brands, MD;  Location: [facility];  Service: General;  Laterality: Right;   INGUINAL HERNIA REPAIR Left 1980   x 2- Dr. Brendan   INGUINAL HERNIA REPAIR Right    JOINT REPLACEMENT Left 2007   shoulder   SHOULDER ARTHROSCOPY Left      reports that he quit smoking about 18 years ago. His smoking use included cigarettes. He has never used smokeless tobacco. He reports that he does not drink alcohol and does not use drugs.  No Known Allergies  Family History  Problem Relation Age of Onset   Diabetes Mother      Prior to Admission medications  Medication Sig Start Date End Date Taking? Authorizing Provider  acetaminophen  (TYLENOL ) 500 MG tablet Take 500 mg by mouth every 6 (six) hours as needed.     [provider]  aspirin  EC 81 MG tablet Take 81 mg by mouth.    [provider]  augmented betamethasone dipropionate (DIPROLENE-AF) 0.05 % ointment APPLY TOPICAL 2(TWO) TIMES A DAY 08/21/17   [provider]  dorzolamide -timolol  (COSOPT ) 22.3-6.8 MG/ML ophthalmic solution Place 1 drop into both eyes 2 (two) times daily. 05/22/17   [provider]  latanoprost  (XALATAN ) 0.005 % ophthalmic solution Place 1 drop into both eyes at bedtime. 07/06/14   [provider]  lovastatin (MEVACOR) 20 MG tablet Take 1 tablet by mouth every morning.     [provider]    Physical Exam: Vitals:   10/03/23 0602 10/03/23 0607 10/03/23 0638 10/03/23 0727  BP:   92/77 (!) 106/57  Pulse: 84 83 73 68  Resp: (!) 21 18 18 16   Temp:   97.9 F (36.6 C) 98.1 F (36.7 C)  TempSrc:   Oral   SpO2: (!) 89% 94% 98% 97%  Weight:   74.3 kg   Height:   5' 6 (1.676 m)     Constitutional: NAD, calm, comfortable Vitals:   10/03/23 0602 10/03/23 0607 10/03/23 0638 10/03/23 0727  BP:   92/77 (!) 106/57  Pulse: 84 83 73 68  Resp: (!) 21 18 18 16   Temp:   97.9 F (36.6 C) 98.1 F (36.7 C)  TempSrc:   Oral   SpO2: (!) 89% 94% 98% 97%  Weight:   74.3 kg   Height:   5' 6 (1.676 m)    Eyes: PERRL, lids and conjunctivae normal ENMT: Mucous membranes are moist. Posterior pharynx clear of any exudate or lesions.Normal dentition.  Neck: normal, supple, no masses, no thyromegaly Respiratory: clear to auscultation bilaterally, no wheezing, no crackles. Normal respiratory effort. No accessory muscle use.  Cardiovascular: Regular rate and rhythm, no murmurs / rubs / gallops. No extremity edema. 2+ pedal pulses. No carotid bruits.  Abdomen: no tenderness, no masses palpated. No hepatosplenomegaly. Bowel sounds positive.  Musculoskeletal: no clubbing / cyanosis. No joint deformity upper and lower extremities. Good ROM, no contractures. Normal muscle tone.  Skin: no rashes, lesions, ulcers. No induration Neurologic: CN 2-12 grossly intact. Sensation intact, DTR normal. Strength 5/5 in all 4.  Psychiatric: Normal judgment and insight. Alert and oriented x 3. Normal mood.    Labs on Admission: I have personally reviewed following labs and imaging studies  CBC: Recent Labs  Lab 10/02/23 2349  WBC 11.4*  NEUTROABS 10.7*  HGB 15.0   HCT 45.4  MCV 89.4  PLT 133*   Basic Metabolic Panel: Recent Labs  Lab 10/02/23 2349  NA 144  K 3.7  CL 108  CO2 21*  GLUCOSE 139*  BUN 19  CREATININE 0.86  CALCIUM 9.2   GFR: Estimated Creatinine Clearance: 60.8 mL/min (by C-G formula based on SCr of 0.86 mg/dL). Liver Function Tests: Recent Labs  Lab 10/02/23 2349  AST 23  ALT 10  ALKPHOS 61  BILITOT 0.7  PROT 7.0  ALBUMIN 3.8   Recent Labs  Lab 10/02/23 2349  LIPASE 38   No results for input(s): AMMONIA in the last 168 hours. Coagulation Profile: No results for input(s): INR, PROTIME in the last 168 hours. Cardiac Enzymes: No results for input(s): CKTOTAL, CKMB, CKMBINDEX, TROPONINI in the last 168 hours. BNP (last 3  results) No results for input(s): PROBNP in the last 8760 hours. HbA1C: No results for input(s): HGBA1C in the last 72 hours. CBG: No results for input(s): GLUCAP in the last 168 hours. Lipid Profile: No results for input(s): CHOL, HDL, LDLCALC, TRIG, CHOLHDL, LDLDIRECT in the last 72 hours. Thyroid Function Tests: No results for input(s): TSH, T4TOTAL, FREET4, T3FREE, THYROIDAB in the last 72 hours. Anemia Panel: No results for input(s): VITAMINB12, FOLATE, FERRITIN, TIBC, IRON, RETICCTPCT in the last 72 hours. Urine analysis:    Component Value Date/Time   COLORURINE YELLOW (A) 10/02/2023 2349   APPEARANCEUR CLOUDY (A) 10/02/2023 2349   LABSPEC 1.009 10/02/2023 2349   PHURINE 6.0 10/02/2023 2349   GLUCOSEU NEGATIVE 10/02/2023 2349   HGBUR LARGE (A) 10/02/2023 2349   BILIRUBINUR NEGATIVE 10/02/2023 2349   KETONESUR NEGATIVE 10/02/2023 2349   PROTEINUR NEGATIVE 10/02/2023 2349   NITRITE NEGATIVE 10/02/2023 2349   LEUKOCYTESUR MODERATE (A) 10/02/2023 2349    Radiological Exams on Admission: CT ABDOMEN PELVIS W CONTRAST Result Date: 10/03/2023 EXAM: CT ABDOMEN AND PELVIS WITH CONTRAST 10/03/2023 03:32:59 AM TECHNIQUE: CT of the  abdomen and pelvis was performed with the administration of intravenous contrast. Multiplanar reformatted images are provided for review. Automated exposure control, iterative reconstruction, and/or weight-based adjustment of the mA/kV was utilized to reduce the radiation dose to as low as reasonably achievable. CONTRAST: 100mL iohexol  (OMNIPAQUE ) 300 MG/ML solution COMPARISON: 06/21/2017 CLINICAL HISTORY: Abdominal pain, acute, nonlocalized; n/v. Table formatting from the original note was not included.; Images from the original note were not included.; Patient C/O vomiting that began about two hours ago. Additionally, patient was on the toilet when he was shaking and fell. Patient has hematoma to right forehead. FINDINGS: LOWER CHEST: Bibasilar atelectasis. LIVER: Multiple small hypodensities within the liver most compatible with hepatic cysts. GALLBLADDER AND BILE DUCTS: No wall thickening. No cholelithiasis. No biliary ductal dilatation. SPLEEN: Normal size. No focal lesion. PANCREAS: No mass. No ductal dilatation. ADRENAL GLANDS: Normal appearance. No mass. KIDNEYS, URETERS AND BLADDER: Multiple bilateral nonobstructing renal calculi measuring up to 14 mm. No hydronephrosis. No perinephric or periureteral stranding. Urinary bladder is unremarkable. GI AND BOWEL: Intermediate-sized hiatal hernia. Diverticulosis of the sigmoid colon without acute inflammation. There is no bowel obstruction. No bowel wall thickening. PERITONEUM AND RETROPERITONEUM: No ascites. No free air. VASCULATURE: Aorta is normal in caliber. Aortobiiliac stent is patent. LYMPH NODES: No lymphadenopathy. REPRODUCTIVE ORGANS: No significant abnormality. BONES AND SOFT TISSUES: No acute osseous abnormality. No focal soft tissue abnormality. IMPRESSION: 1. No acute findings in the abdomen or pelvis. 2. Intermediate-sized hiatal hernia. Electronically signed by: Franky Stanford MD 10/03/2023 04:05 AM EDT RP Workstation: [id number]   DG Chest Port 1  View Result Date: 10/03/2023 CLINICAL DATA:  Shortness of breath with hypoxia. EXAM: PORTABLE CHEST 1 VIEW COMPARISON:  PA Lat chest 08/18/2014. FINDINGS: There is a low inspiration. There is patchy opacity in the left-greater-than-right lung bases, which could be due to atelectasis from low lung volumes or could be due to pneumonia. The heart is enlarged. No vascular congestion is seen allowing for low inspiration. The aorta is tortuous and calcified with stable mediastinum. There is no substantial pleural effusion. 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 No new osseous finding. IMPRESSION: 1. Low inspiration with patchy opacity in the left-greater-than-right lung bases, which could be due to atelectasis from low lung volumes or could be due to pneumonia or aspiration. A follow-up study is recommended in full inspiration. 2.  Cardiomegaly. 3. Aortic atherosclerosis. Electronically Signed   By: Francis Quam M.D.   On: 10/03/2023 01:36   CT Head Wo Contrast Result Date: 10/03/2023 CLINICAL DATA:  Neck trauma (Age >= 65y); Head trauma, minor (Age >= 65y), vomiting, fall EXAM: CT HEAD WITHOUT CONTRAST CT CERVICAL SPINE WITHOUT CONTRAST TECHNIQUE: Multidetector CT imaging of the head and cervical spine was performed following the standard protocol without intravenous contrast. Multiplanar CT image reconstructions of the cervical spine were also generated. RADIATION DOSE REDUCTION: This exam was performed according to the departmental dose-optimization program which includes automated exposure control, adjustment of the mA and/or kV according to patient size and/or use of iterative reconstruction technique. COMPARISON:  None Available. FINDINGS: CT HEAD FINDINGS Brain: Normal anatomic configuration. Parenchymal volume loss is commensurate with the patient's age. Mild periventricular white matter changes are present likely reflecting the sequela of small vessel ischemia. 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 Tiny remote lacunar infarct within the left pons. No abnormal intra or extra-axial mass lesion or fluid collection. No abnormal mass effect or midline shift. No evidence of acute intracranial hemorrhage or infarct. Ventricular size is normal. Cerebellum unremarkable. Vascular: No asymmetric hyperdense vasculature at the skull base. Skull: Intact Sinuses/Orbits: Paranasal sinuses are clear. Orbits are unremarkable. Other: Mastoid air cells and middle ear cavities are clear. Moderate right frontal scalp hematoma. CT CERVICAL SPINE FINDINGS Alignment: 2 mm retrolisthesis C3-4 and C4-5. Skull base and vertebrae: Craniocervical alignment is normal. The atlantodental interval is not widened. No acute fracture of the cervical spine Soft tissues and spinal canal: No prevertebral fluid or swelling. No visible canal hematoma. Disc levels: Disc space narrowing, endplate remodeling, subchondral cyst formation noted throughout the cervical spine in keeping with changes of advanced degenerative disc disease, most severe at C3-4 and C5-6. Posterior disc osteophyte complex at C3-4 in combination with retrolisthesis results in moderate central canal stenosis with an AP diameter of the spinal canal approximately 5-6 mm and flattening of the thecal sac. Milder changes are seen at C4-5. Multilevel uncovertebral and facet arthrosis results in multilevel moderate to severe neuroforaminal narrowing most severe on the right at C4-5 and C5-6 and the left at C3-4 C4-5 and C5-6. Upper chest: Severe emphysema Other: Aneurysm the thoracic aorta, incompletely evaluated on this examination measuring at least 3.9 cm at the arch. IMPRESSION: 1. No acute intracranial abnormality. No calvarial fracture. Moderate right frontal scalp hematoma. 2. No acute fracture or listhesis of the cervical spine. 3. 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 4. Aneurysm of the thoracic aorta, incompletely evaluated on this examination measuring at least 3.9 cm at the arch. If indicated, this would be better assessed with dedicated CT arterial 5. Emphysema. Emphysema (ICD10-J43.9). Electronically Signed   By: Dorethia Molt M.D.   On: 10/03/2023 00:32   CT Cervical Spine Wo Contrast Result Date: 10/03/2023 CLINICAL DATA:  Neck trauma (Age >= 65y); Head trauma, minor (Age >= 65y), vomiting, fall EXAM: CT HEAD WITHOUT CONTRAST CT CERVICAL SPINE WITHOUT CONTRAST TECHNIQUE: Multidetector CT imaging of the head and cervical spine was performed following the standard protocol without intravenous contrast. Multiplanar CT image reconstructions of the cervical spine were also generated. RADIATION DOSE REDUCTION: This exam was performed according to the departmental dose-optimization program which includes automated exposure control, adjustment of the mA and/or kV according to patient size  and/or use of iterative reconstruction technique. COMPARISON:  None Available. FINDINGS: CT HEAD FINDINGS Brain: Normal anatomic configuration. Parenchymal volume loss is commensurate with the patient's age. Mild periventricular white matter changes are present likely reflecting the sequela of small vessel ischemia. 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 Tiny remote lacunar infarct within the left pons. No abnormal intra or extra-axial mass lesion or fluid collection. No abnormal mass effect or midline shift. No evidence of acute intracranial hemorrhage or infarct. Ventricular size is normal. Cerebellum unremarkable. Vascular: No asymmetric hyperdense vasculature at the skull base. Skull: Intact Sinuses/Orbits: Paranasal sinuses are clear. Orbits are unremarkable. Other: Mastoid air cells and middle ear cavities are clear.  Moderate right frontal scalp hematoma. CT CERVICAL SPINE FINDINGS Alignment: 2 mm retrolisthesis C3-4 and C4-5. Skull base and vertebrae: Craniocervical alignment is normal. The atlantodental interval is not widened. No acute fracture of the cervical spine Soft tissues and spinal canal: No prevertebral fluid or swelling. No visible canal hematoma. Disc levels: Disc space narrowing, endplate remodeling, subchondral cyst formation noted throughout the cervical spine in keeping with changes of advanced degenerative disc disease, most severe at C3-4 and C5-6. Posterior disc osteophyte complex at C3-4 in combination with retrolisthesis results in moderate central canal stenosis with an AP diameter of the spinal canal approximately 5-6 mm and flattening of the thecal sac. Milder changes are seen at C4-5. Multilevel uncovertebral and facet arthrosis results in multilevel moderate to severe neuroforaminal narrowing most severe on the right at C4-5 and C5-6 and the left at C3-4 C4-5 and C5-6. Upper chest: Severe emphysema Other: Aneurysm the thoracic aorta, incompletely evaluated on this examination measuring at least 3.9 cm at the arch. IMPRESSION: 1. No acute intracranial abnormality. No calvarial fracture. Moderate right frontal scalp hematoma. 2. No acute fracture or listhesis of the cervical spine. 3. 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 4. Aneurysm of the thoracic aorta, incompletely evaluated on this examination measuring at least 3.9 cm at the arch. If indicated, this would be better assessed with dedicated CT arterial 5. Emphysema. Emphysema (ICD10-J43.9). Electronically Signed   By: Dorethia Molt M.D.   On: 10/03/2023 00:32    EKG: Independently reviewed.  Sinus tachycardia, no acute ST changes.  Assessment/Plan Principal Problem:   Sepsis (HCC)  (please  populate well all problems here in Problem List. (For example, if patient is on BP meds at home and you resume or decide to hold them, it is a problem that needs to be her. Same for CAD, COPD, HLD and so on)   Sepsis, without acute endorgan damage Complicated UTI with hematuria - Sepsis as evidenced by tachycardia new onset fever, source infection is UTI. - Continue cefadroxil while waiting for urine culture - PVR - Start patient on Flomax  - DDx, chest x-ray showed atelectasis versus infiltrates on bilateral lower lungs however CT abdominal pelvis showed atelectasis more likely.  Pneumonia less likely, will de-escalate antibiotics to cover UTI only.  Acute hypoxia - Likely secondary to bilateral lower lung atelectasis - Patient has no other respiratory symptoms - Start incentive spirometry  Deconditioning and fall- -secondary to UTI, management as above - PT evaluation  BPH - Check PVR - Start Flomax  - Urology follow-up information given  Total time spent on patient care 55 minutes  DVT prophylaxis: Lovenox  Code Status: Full code Family Communication: Wife at  bedside Disposition Plan: Patient sick with sepsis secondary to UTI requiring IV antibiotics, expect more than 2 midnight hospital stay Consults called: None Admission status: Tele admit   Cort ONEIDA Mana MD [facility] Pager [phone]  10/03/2023, 9:16 AM

## 2023-10-03 NOTE — ED Notes (Signed)
 Trialing patient off of O2

## 2023-10-03 NOTE — Evaluation (Signed)
 Physical Therapy Evaluation Patient Details Name: Jason Reese MRN: [id number] DOB: 05/25/1940 Today's Date: 10/03/2023  History of Present Illness  Jason Reese is a 83 y.o. male with medical history significant of AAA s/p repair, HLD, glucoma, presented with multiple complaints including fever and chills, trouble to urinate, general weakness and fall. Patient found to have UTI.  Clinical Impression  Patient received in bed with friend present in room. He is very HOH. Pleasant and agrees to PT assessment. Patient is mod I with bed mobility and supervision for transfers. He ambulated to door and back and into bathroom for toileting. No assistance needed. Cga. Patient left in recliner with alarm donned. His O2 sats after walking was 97% on room air. Patient will continue to benefit from skilled PT to improve independence.         If plan is discharge home, recommend the following: Assist for transportation;Help with stairs or ramp for entrance   Can travel by private vehicle    yes    Equipment Recommendations None recommended by PT  Recommendations for Other Services       Functional Status Assessment Patient has had a recent decline in their functional status and demonstrates the ability to make significant improvements in function in a reasonable and predictable amount of time.     Precautions / Restrictions Precautions Precautions: Fall Recall of Precautions/Restrictions: Intact Restrictions Weight Bearing Restrictions Per Provider Order: No      Mobility  Bed Mobility Overal bed mobility: Independent                  Transfers Overall transfer level: Needs assistance Equipment used: None Transfers: Sit to/from Stand Sit to Stand: Supervision                Ambulation/Gait Ambulation/Gait assistance: Contact guard assist Gait Distance (Feet): 25 Feet Assistive device: None Gait Pattern/deviations: Step-through pattern Gait velocity: decr      General Gait Details: patient ambulates well in room without AD. No lob.  Stairs            Wheelchair Mobility     Tilt Bed    Modified Rankin (Stroke Patients Only)       Balance Overall balance assessment: Mild deficits observed, not formally tested                                           Pertinent Vitals/Pain Pain Assessment Pain Assessment: No/denies pain    Home Living Family/patient expects to be discharged to:: Private residence Living Arrangements: Spouse/significant other Available Help at Discharge: Family             Home Equipment: Agricultural consultant (2 wheels)      Prior Function Prior Level of Function : Independent/Modified Independent;Driving             Mobility Comments: does not use AD at baseline ADLs Comments: independent     Extremity/Trunk Assessment   Upper Extremity Assessment Upper Extremity Assessment: Overall WFL for tasks assessed    Lower Extremity Assessment Lower Extremity Assessment: Overall WFL for tasks assessed    Cervical / Trunk Assessment Cervical / Trunk Assessment: Normal  Communication   Communication Communication: Impaired Factors Affecting Communication: Hearing impaired    Cognition Arousal: Alert Behavior During Therapy: WFL for tasks assessed/performed   PT - Cognitive impairments: No apparent impairments  Following commands: Intact       Cueing Cueing Techniques: Verbal cues     General Comments      Exercises     Assessment/Plan    PT Assessment Patient needs continued PT services  PT Problem List Decreased strength;Decreased activity tolerance;Decreased mobility;Decreased balance       PT Treatment Interventions Gait training;Stair training;Functional mobility training;Therapeutic activities;Therapeutic exercise;Balance training;Patient/family education    PT Goals (Current goals can be found in the Care Plan section)   Acute Rehab PT Goals Patient Stated Goal: return home, feel better PT Goal Formulation: With patient Time For Goal Achievement: 10/10/23 Potential to Achieve Goals: Good    Frequency Min 2X/week     Co-evaluation               AM-PAC PT 6 Clicks Mobility  Outcome Measure Help needed turning from your back to your side while in a flat bed without using bedrails?: None Help needed moving from lying on your back to sitting on the side of a flat bed without using bedrails?: None Help needed moving to and from a bed to a chair (including a wheelchair)?: A Little Help needed standing up from a chair using your arms (e.g., wheelchair or bedside chair)?: A Little Help needed to walk in hospital room?: A Little Help needed climbing 3-5 steps with a railing? : A Little 6 Click Score: 20    End of Session Equipment Utilized During Treatment: Oxygen Activity Tolerance: Patient tolerated treatment well Patient left: in chair;with call bell/phone within reach;with chair alarm set;with family/visitor present Nurse Communication: Mobility status PT Visit Diagnosis: Other abnormalities of gait and mobility (R26.89);Muscle weakness (generalized) (M62.81);Difficulty in walking, not elsewhere classified (R26.2)    Time: 1346-1400 PT Time Calculation (min) (ACUTE ONLY): 14 min   Charges:   PT Evaluation $PT Eval Low Complexity: 1 Low   PT General Charges $$ ACUTE PT VISIT: 1 Visit         Jason Reese, PT, GCS 10/03/23,2:23 PM

## 2023-10-03 NOTE — ED Notes (Addendum)
 Placed patient back on 2L [place] after patient's O2 saturations dropped to 89 on room air

## 2023-10-04 DIAGNOSIS — A419 Sepsis, unspecified organism: Secondary | ICD-10-CM | POA: Diagnosis not present

## 2023-10-04 LAB — CBC
HCT: 39.2 % (ref 39.0–52.0)
Hemoglobin: 12.7 g/dL — ABNORMAL LOW (ref 13.0–17.0)
MCH: 29.6 pg (ref 26.0–34.0)
MCHC: 32.4 g/dL (ref 30.0–36.0)
MCV: 91.4 fL (ref 80.0–100.0)
Platelets: 110 K/uL — ABNORMAL LOW (ref 150–400)
RBC: 4.29 MIL/uL (ref 4.22–5.81)
RDW: 14.2 % (ref 11.5–15.5)
WBC: 10.8 K/uL — ABNORMAL HIGH (ref 4.0–10.5)
nRBC: 0 % (ref 0.0–0.2)

## 2023-10-04 LAB — PROCALCITONIN: Procalcitonin: 20.41 ng/mL

## 2023-10-04 MED ORDER — LACTATED RINGERS IV SOLN: INTRAVENOUS | Status: DC

## 2023-10-04 NOTE — Plan of Care (Signed)

## 2023-10-04 NOTE — Progress Notes (Signed)
 PROGRESS NOTE    Jason Reese  FMW:[id number] DOB: [birth year] DOA: 10/02/2023 PCP: Jason Honor [name], MD    Brief Narrative:  83 y.o. male with medical history significant of AAA s/p repair, HLD, glucoma, presented with multiple complaints including fever and chills, trouble to urinate, general weakness and fall.   Patient started to feel malaise and general weakness yesterday, started to feel nauseous and vomited couple of times at home and fell and hit his forehead in the toilet while urinating.  Denied any LOC.  Denied any weakness or numbness of the limbs.  He also complained about burning sensation when urinate in the last few days.  At baseline, he has had trouble to urinate for few months, with protein streams and dripping at the end of urination.  Denied any back pain.  Family called EMS, EMS arrived and found patient had a fever of 102F temperature 102, heart rate 114 blood pressure 140/77 O2 saturation 95% on room air.  Chest x-ray showed bilateral atelectasis versus infiltrates.  CT abdomen pelvis that showed no acute infiltrates in lower lungs, no acute finding in abdomen.  UA showed 3+ WBC and 3+ RBC, blood work showed WBC 14.5 hemoglobin 15 COVID-negative BUN 19 creatinine 0.8.  Lactic acid 1.9 > 1.4.  Patient was given ceftriaxone  and azithromycin  in the ED.     Assessment & Plan:   Principal Problem:   Sepsis (HCC)  Sepsis, without acute endorgan damage Complicated UTI with hematuria Plan: Continue Rocephin  Continue Flomax  Monitor vitals and fever curve Strict intake and output\   Acute hypoxia Resolved Encourage incentive spirometry   Deconditioning and fall- Request therapy evaluations   BPH Every 6 hour bladder scan Flomax     DVT prophylaxis: Lovenox  Code Status: Full Family Communication: Spouse at bedside 9/5 Disposition Plan: Status is: Inpatient Remains inpatient appropriate because: Sepsis secondary to UTI   Level of care: Telemetry  Medical  Consultants:  None  Procedures:  None  Antimicrobials: None   Subjective: Seen and examined.  Reports improvement in symptoms since admission.  Reports response to Flomax .  Objective: Vitals:   10/03/23 1553 10/03/23 1923 10/04/23 0408 10/04/23 0756  BP: (!) 117/57 (!) 143/74 (!) 133/59 (!) 159/59  Pulse: 66 68 (!) 58 60  Resp: 17 16 14 17   Temp: 97.7 F (36.5 C) 98 F (36.7 C) 98 F (36.7 C) 97.8 F (36.6 C)  TempSrc:      SpO2: 96% 97% 95% 96%  Weight:      Height:        Intake/Output Summary (Last 24 hours) at 10/04/2023 1310 Last data filed at 10/04/2023 1259 Gross per 24 hour  Intake 240 ml  Output 250 ml  Net -10 ml   Filed Weights   10/02/23 2341 10/03/23 0638  Weight: 71.7 kg 74.3 kg    Examination:  General exam: Appears fatigued Respiratory system: Clear to auscultation. Respiratory effort normal. Cardiovascular system: 1 S2, RRR, no murmurs, no pedal edema Gastrointestinal system: Soft, NT/ND, normal bowel sounds Central nervous system: Alert and oriented. No focal neurological deficits. Extremities: Symmetric 5 x 5 power. Skin: No rashes, lesions or ulcers Psychiatry: Judgement and insight appear normal. Mood & affect appropriate.     Data Reviewed: I have personally reviewed following labs and imaging studies  CBC: Recent Labs  Lab 10/02/23 2349 10/04/23 0416  WBC 11.4* 10.8*  NEUTROABS 10.7*  --   HGB 15.0 12.7*  HCT 45.4 39.2  MCV 89.4 91.4  PLT  133* 110*   Basic Metabolic Panel: Recent Labs  Lab 10/02/23 2349  NA 144  K 3.7  CL 108  CO2 21*  GLUCOSE 139*  BUN 19  CREATININE 0.86  CALCIUM 9.2   GFR: Estimated Creatinine Clearance: 60.8 mL/min (by C-G formula based on SCr of 0.86 mg/dL). Liver Function Tests: Recent Labs  Lab 10/02/23 2349  AST 23  ALT 10  ALKPHOS 61  BILITOT 0.7  PROT 7.0  ALBUMIN 3.8   Recent Labs  Lab 10/02/23 2349  LIPASE 38   No results for input(s): AMMONIA in the last  168 hours. Coagulation Profile: No results for input(s): INR, PROTIME in the last 168 hours. Cardiac Enzymes: No results for input(s): CKTOTAL, CKMB, CKMBINDEX, TROPONINI in the last 168 hours. BNP (last 3 results) No results for input(s): PROBNP in the last 8760 hours. HbA1C: No results for input(s): HGBA1C in the last 72 hours. CBG: No results for input(s): GLUCAP in the last 168 hours. Lipid Profile: No results for input(s): CHOL, HDL, LDLCALC, TRIG, CHOLHDL, LDLDIRECT in the last 72 hours. Thyroid Function Tests: No results for input(s): TSH, T4TOTAL, FREET4, T3FREE, THYROIDAB in the last 72 hours. Anemia Panel: No results for input(s): VITAMINB12, FOLATE, FERRITIN, TIBC, IRON, RETICCTPCT in the last 72 hours. Sepsis Labs: Recent Labs  Lab 10/02/23 2349 10/03/23 0217 10/04/23 0416  PROCALCITON  --   --  20.41  LATICACIDVEN 1.9 1.4  --     Recent Results (from the past 240 hours)  Resp panel by RT-PCR (RSV, Flu A&B, Covid) Anterior Nasal Swab     Status: None   Collection Time: 10/02/23 11:49 PM   Specimen: Anterior Nasal Swab  Result Value Ref Range Status   SARS Coronavirus 2 by RT PCR NEGATIVE NEGATIVE Final    Comment: (NOTE) SARS-CoV-2 target nucleic acids are NOT DETECTED.  The SARS-CoV-2 RNA is generally detectable in upper respiratory specimens during the acute phase of infection. The lowest concentration of SARS-CoV-2 viral copies this assay can detect is 138 copies/mL. A negative result does not preclude SARS-Cov-2 infection and should not be used as the sole basis for treatment or other patient management decisions. A negative result may occur with  improper specimen collection/handling, submission of specimen other than nasopharyngeal swab, presence of viral mutation(s) within the areas targeted by this assay, and inadequate number of viral copies(<138 copies/mL). A negative result must be combined  with clinical observations, patient history, and epidemiological information. The expected result is Negative.  Fact Sheet for Patients:  BloggerCourse.com  Fact Sheet for Healthcare Providers:  SeriousBroker.it  This test is no t yet approved or cleared by the [place]  FDA and  has been authorized for detection and/or diagnosis of SARS-CoV-2 by FDA under an Emergency Use Authorization (EUA). This EUA will remain  in effect (meaning this test can be used) for the duration of the COVID-19 declaration under Section 564(b)(1) of the Act, 21 U.S.C.section 360bbb-3(b)(1), unless the authorization is terminated  or revoked sooner.       Influenza A by PCR NEGATIVE NEGATIVE Final   Influenza B by PCR NEGATIVE NEGATIVE Final    Comment: (NOTE) The Xpert Xpress SARS-CoV-2/FLU/RSV plus assay is intended as an aid in the diagnosis of influenza from Nasopharyngeal swab specimens and should not be used as a sole basis for treatment. Nasal washings and aspirates are unacceptable for Xpert Xpress SARS-CoV-2/FLU/RSV testing.  Fact Sheet for Patients: BloggerCourse.com  Fact Sheet for Healthcare Providers: SeriousBroker.it  This test  is not yet approved or cleared by the [place]  FDA and has been authorized for detection and/or diagnosis of SARS-CoV-2 by FDA under an Emergency Use Authorization (EUA). This EUA will remain in effect (meaning this test can be used) for the duration of the COVID-19 declaration under Section 564(b)(1) of the Act, 21 U.S.C. section 360bbb-3(b)(1), unless the authorization is terminated or revoked.     Resp Syncytial Virus by PCR NEGATIVE NEGATIVE Final    Comment: (NOTE) Fact Sheet for Patients: BloggerCourse.com  Fact Sheet for Healthcare Providers: SeriousBroker.it  This test is not yet approved  or cleared by the [place]  FDA and has been authorized for detection and/or diagnosis of SARS-CoV-2 by FDA under an Emergency Use Authorization (EUA). This EUA will remain in effect (meaning this test can be used) for the duration of the COVID-19 declaration under Section 564(b)(1) of the Act, 21 U.S.C. section 360bbb-3(b)(1), unless the authorization is terminated or revoked.  Performed at [facility], [address], [place], [place] [address]   Urine Culture (for pregnant, neutropenic or urologic patients or patients with an indwelling urinary catheter)     Status: Abnormal (Preliminary result)   Collection Time: 10/02/23 11:49 PM   Specimen: Urine, Clean Catch  Result Value Ref Range Status   Specimen Description   Final    URINE, CLEAN CATCH Performed at [facility], [address], [place], [place] [address]    Special Requests   Final    NONE Performed at [facility], [address] [place], [place] [address]    Culture (A)  Final    >=100,000 COLONIES/mL AEROCOCCUS SPECIES CULTURE REINCUBATED FOR BETTER GROWTH Performed at [facility], [address], [place], [place] [address]    Report Status PENDING  Incomplete         Radiology Studies: CT ABDOMEN PELVIS W CONTRAST Result Date: 10/03/2023 EXAM: CT ABDOMEN AND PELVIS WITH CONTRAST 10/03/2023 03:32:59 AM TECHNIQUE: CT of the abdomen and pelvis was performed with the administration of intravenous contrast. Multiplanar reformatted images are provided for review. Automated exposure control, iterative reconstruction, and/or weight-based adjustment of the mA/kV was utilized to reduce the radiation dose to as low as reasonably achievable. CONTRAST: 100mL iohexol  (OMNIPAQUE ) 300 MG/ML solution COMPARISON: 06/21/2017 CLINICAL HISTORY: Abdominal pain, acute, nonlocalized; n/v. Table formatting from the original note was not included.; Images from the original note were not included.;  Patient C/O vomiting that began about two hours ago. Additionally, patient was on the toilet when he was shaking and fell. Patient has hematoma to right forehead. FINDINGS: LOWER CHEST: Bibasilar atelectasis. LIVER: Multiple small hypodensities within the liver most compatible with hepatic cysts. GALLBLADDER AND BILE DUCTS: No wall thickening. No cholelithiasis. No biliary ductal dilatation. SPLEEN: Normal size. No focal lesion. PANCREAS: No mass. No ductal dilatation. ADRENAL GLANDS: Normal appearance. No mass. KIDNEYS, URETERS AND BLADDER: Multiple bilateral nonobstructing renal calculi measuring up to 14 mm. No hydronephrosis. No perinephric or periureteral stranding. Urinary bladder is unremarkable. GI AND BOWEL: Intermediate-sized hiatal hernia. Diverticulosis of the sigmoid colon without acute inflammation. There is no bowel obstruction. No bowel wall thickening. PERITONEUM AND RETROPERITONEUM: No ascites. No free air. VASCULATURE: Aorta is normal in caliber. Aortobiiliac stent is patent. LYMPH NODES: No lymphadenopathy. REPRODUCTIVE ORGANS: No significant abnormality. BONES AND SOFT TISSUES: No acute osseous abnormality. No focal soft tissue abnormality. IMPRESSION: 1. No acute findings in the abdomen or pelvis. 2. Intermediate-sized hiatal hernia. Electronically signed by: Franky Stanford MD  10/03/2023 04:05 AM EDT RP Workstation: [id number]   DG Chest Port 1 View Result Date: 10/03/2023 CLINICAL DATA:  Shortness of breath with hypoxia. EXAM: PORTABLE CHEST 1 VIEW COMPARISON:  PA Lat chest 08/18/2014. FINDINGS: There is a low inspiration. There is patchy opacity in the left-greater-than-right lung bases, which could be due to atelectasis from low lung volumes or could be due to pneumonia. The heart is enlarged. No vascular congestion is seen allowing for low inspiration. The aorta is tortuous and calcified with stable mediastinum. There is no substantial pleural effusion. 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 No new osseous finding. IMPRESSION: 1. Low inspiration with patchy opacity in the left-greater-than-right lung bases, which could be due to atelectasis from low lung volumes or could be due to pneumonia or aspiration. A follow-up study is recommended in full inspiration. 2. Cardiomegaly. 3. Aortic atherosclerosis. Electronically Signed   By: Francis Quam M.D.   On: 10/03/2023 01:36   CT Head Wo Contrast Result Date: 10/03/2023 CLINICAL DATA:  Neck trauma (Age >= 65y); Head trauma, minor (Age >= 65y), vomiting, fall EXAM: CT HEAD WITHOUT CONTRAST CT CERVICAL SPINE WITHOUT CONTRAST TECHNIQUE: Multidetector CT imaging of the head and cervical spine was performed following the standard protocol without intravenous contrast. Multiplanar CT image reconstructions of the cervical spine were also generated. RADIATION DOSE REDUCTION: This exam was performed according to the departmental dose-optimization program which includes automated exposure control, adjustment of the mA and/or kV according to patient size and/or use of iterative reconstruction technique. COMPARISON:  None Available. FINDINGS: CT HEAD FINDINGS Brain: Normal anatomic configuration. Parenchymal volume loss is commensurate with the patient's age. Mild periventricular white matter changes are present likely reflecting the sequela of small vessel ischemia. 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 Tiny remote lacunar infarct within the left pons. No abnormal intra or extra-axial mass lesion or fluid collection. No abnormal mass effect or midline shift. No evidence of acute intracranial hemorrhage or infarct. Ventricular size is normal. Cerebellum unremarkable. Vascular: No asymmetric hyperdense vasculature at the skull base. Skull: Intact Sinuses/Orbits: Paranasal sinuses are clear. Orbits are unremarkable. Other: Mastoid air cells and middle ear cavities are clear.  Moderate right frontal scalp hematoma. CT CERVICAL SPINE FINDINGS Alignment: 2 mm retrolisthesis C3-4 and C4-5. Skull base and vertebrae: Craniocervical alignment is normal. The atlantodental interval is not widened. No acute fracture of the cervical spine Soft tissues and spinal canal: No prevertebral fluid or swelling. No visible canal hematoma. Disc levels: Disc space narrowing, endplate remodeling, subchondral cyst formation noted throughout the cervical spine in keeping with changes of advanced degenerative disc disease, most severe at C3-4 and C5-6. Posterior disc osteophyte complex at C3-4 in combination with retrolisthesis results in moderate central canal stenosis with an AP diameter of the spinal canal approximately 5-6 mm and flattening of the thecal sac. Milder changes are seen at C4-5. Multilevel uncovertebral and facet arthrosis results in multilevel moderate to severe neuroforaminal narrowing most severe on the right at C4-5 and C5-6 and the left at C3-4 C4-5 and C5-6. Upper chest: Severe emphysema Other: Aneurysm the thoracic aorta, incompletely evaluated on this examination measuring at least 3.9 cm at the arch. IMPRESSION: 1. No acute intracranial abnormality. No calvarial fracture. Moderate right frontal scalp hematoma. 2. No acute fracture or listhesis of the cervical spine. 3. 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 4. Aneurysm of the thoracic aorta, incompletely evaluated on this examination measuring at least 3.9 cm at the arch. If indicated, this would be better assessed with dedicated CT arterial 5. Emphysema. Emphysema (ICD10-J43.9). Electronically Signed   By: Dorethia Molt M.D.   On: 10/03/2023 00:32   CT Cervical Spine Wo Contrast Result Date: 10/03/2023 CLINICAL DATA:  Neck trauma (Age >= 65y); Head trauma, minor (Age >= 65y),  vomiting, fall EXAM: CT HEAD WITHOUT CONTRAST CT CERVICAL SPINE WITHOUT CONTRAST TECHNIQUE: Multidetector CT imaging of the head and cervical spine was performed following the standard protocol without intravenous contrast. Multiplanar CT image reconstructions of the cervical spine were also generated. RADIATION DOSE REDUCTION: This exam was performed according to the departmental dose-optimization program which includes automated exposure control, adjustment of the mA and/or kV according to patient size and/or use of iterative reconstruction technique. COMPARISON:  None Available. FINDINGS: CT HEAD FINDINGS Brain: Normal anatomic configuration. Parenchymal volume loss is commensurate with the patient's age. Mild periventricular white matter changes are present likely reflecting the sequela of small vessel ischemia. 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 Tiny remote lacunar infarct within the left pons. No abnormal intra or extra-axial mass lesion or fluid collection. No abnormal mass effect or midline shift. No evidence of acute intracranial hemorrhage or infarct. Ventricular size is normal. Cerebellum unremarkable. Vascular: No asymmetric hyperdense vasculature at the skull base. Skull: Intact Sinuses/Orbits: Paranasal sinuses are clear. Orbits are unremarkable. Other: Mastoid air cells and middle ear cavities are clear. Moderate right frontal scalp hematoma. CT CERVICAL SPINE FINDINGS Alignment: 2 mm retrolisthesis C3-4 and C4-5. Skull base and vertebrae: Craniocervical alignment is normal. The atlantodental interval is not widened. No acute fracture of the cervical spine Soft tissues and spinal canal: No prevertebral fluid or swelling. No visible canal hematoma. Disc levels: Disc space narrowing, endplate remodeling, subchondral cyst formation noted throughout the cervical spine in keeping with changes of advanced degenerative disc disease, most severe at C3-4 and  C5-6. Posterior disc osteophyte complex at C3-4 in combination with retrolisthesis results in moderate central canal stenosis with an AP diameter of the spinal canal approximately 5-6 mm and flattening of the thecal sac. Milder changes are seen at C4-5. Multilevel uncovertebral and facet arthrosis results in multilevel moderate to severe neuroforaminal narrowing most severe on the right at C4-5 and C5-6 and the left at C3-4 C4-5 and C5-6. Upper chest: Severe emphysema Other: Aneurysm the thoracic aorta, incompletely evaluated on this examination measuring at least 3.9 cm at the arch. IMPRESSION: 1. No acute intracranial abnormality. No calvarial fracture. Moderate right frontal scalp hematoma. 2. No acute fracture or listhesis of the cervical spine. 3. 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 4. Aneurysm of the thoracic aorta, incompletely evaluated on this examination measuring at least 3.9 cm at the arch. If indicated, this would be better assessed with dedicated CT arterial 5. Emphysema. Emphysema (ICD10-J43.9). Electronically Signed   By: Dorethia Molt M.D.   On: 10/03/2023 00:32        Scheduled Meds:  aspirin  EC  81 mg Oral Daily   dorzolamide -timolol   1 drop Both Eyes BID   enoxaparin  (LOVENOX ) injection  40 mg Subcutaneous Q24H   latanoprost   1 drop Both Eyes QHS   pravastatin   20 mg Oral q1800   tamsulosin   0.4 mg Oral QPC supper   Continuous Infusions:  cefTRIAXone  (ROCEPHIN )  IV 1 g (10/04/23 9361)   lactated ringers  75 mL/hr at 10/04/23 1155     LOS: 1 day      Calvin KATHEE Robson, MD [facility]   If 7PM-7AM, please contact night-coverage  10/04/2023, 1:10 PM

## 2023-10-04 NOTE — Progress Notes (Signed)
 Physical Therapy Treatment Patient Details Name: Jason Reese MRN: [id number] DOB: 1940-02-23 Today's Date: 10/04/2023   History of Present Illness Jason Reese is a 83 y.o. male with medical history significant of AAA s/p repair, HLD, glucoma, presented with multiple complaints including fever and chills, trouble to urinate, general weakness and fall. Patient found to have UTI.    PT Comments  Pt able to ambulate 250 ft with CGA for safety. No endorsements of dizziness with exertion. Pt demonstrated 1 minor LOB 2/2 R foot crossing midline when turning head while ambulating, however he was able to self correct. Pt able to perform 8 steps using R hand rail with SBA for safety. Pt demonstrates all bed mobility/transfers/ambulation at baseline level. Pt does not require any further PT needs at this time. Pt will be dc in house and does not require follow up. RN aware. Will dc current orders.    If plan is discharge home, recommend the following: Assist for transportation;Help with stairs or ramp for entrance   Can travel by private vehicle        Equipment Recommendations  None recommended by PT    Recommendations for Other Services       Precautions / Restrictions Precautions Precautions: Fall Recall of Precautions/Restrictions: Intact Restrictions Weight Bearing Restrictions Per Provider Order: No     Mobility  Bed Mobility Overal bed mobility: Independent             General bed mobility comments: Independent to get to EOB and fro STS at EOB    Transfers Overall transfer level: Needs assistance Equipment used: None Transfers: Sit to/from Stand Sit to Stand: Supervision           General transfer comment: Able to stand from EOB while donning and doffing gown. No LOB.    Ambulation/Gait Ambulation/Gait assistance: Contact guard assist Gait Distance (Feet): 250 Feet Assistive device: None Gait Pattern/deviations: Step-through pattern Gait velocity: decr      General Gait Details: Ambulates 250 ft with minor LOB, however pt able to self correct.   Stairs Stairs: Yes Stairs assistance: Supervision Stair Management: One rail Right, Alternating pattern Number of Stairs: 8 General stair comments: Pt able to perform 8 steps total with SBA from SPT for safety using rail on the R side   Wheelchair Mobility     Tilt Bed    Modified Rankin (Stroke Patients Only)       Balance Overall balance assessment: Mild deficits observed, not formally tested                                          Communication Communication Communication: Impaired Factors Affecting Communication: Hearing impaired  Cognition Arousal: Alert Behavior During Therapy: WFL for tasks assessed/performed   PT - Cognitive impairments: No apparent impairments                       PT - Cognition Comments: Pleasant and agreeable to PT session Following commands: Intact      Cueing Cueing Techniques: Verbal cues, Visual cues  Exercises      General Comments        Pertinent Vitals/Pain Pain Assessment Pain Assessment: No/denies pain    Home Living  Prior Function            PT Goals (current goals can now be found in the care plan section) Acute Rehab PT Goals Patient Stated Goal: return home, feel better PT Goal Formulation: With patient Time For Goal Achievement: 10/10/23 Potential to Achieve Goals: Good Progress towards PT goals: Goals met/education completed, patient discharged from PT    Frequency    Other (Comment) (dc in house.)      PT Plan      Co-evaluation              AM-PAC PT 6 Clicks Mobility   Outcome Measure  Help needed turning from your back to your side while in a flat bed without using bedrails?: None Help needed moving from lying on your back to sitting on the side of a flat bed without using bedrails?: None Help needed moving to and from a  bed to a chair (including a wheelchair)?: A Little Help needed standing up from a chair using your arms (e.g., wheelchair or bedside chair)?: None Help needed to walk in hospital room?: A Little Help needed climbing 3-5 steps with a railing? : A Little 6 Click Score: 21    End of Session Equipment Utilized During Treatment: Gait belt Activity Tolerance: Patient tolerated treatment well Patient left: in bed;with bed alarm set;with call bell/phone within reach;with family/visitor present Nurse Communication: Mobility status PT Visit Diagnosis: Other abnormalities of gait and mobility (R26.89);Muscle weakness (generalized) (M62.81);Difficulty in walking, not elsewhere classified (R26.2)     Time: 9058-9047 PT Time Calculation (min) (ACUTE ONLY): 11 min  Charges:                            Myles Mallicoat, SPT    Jamont Mellin 10/04/2023, 10:01 AM

## 2023-10-05 DIAGNOSIS — A419 Sepsis, unspecified organism: Secondary | ICD-10-CM | POA: Diagnosis not present

## 2023-10-05 LAB — CBC WITH DIFFERENTIAL/PLATELET
Abs Immature Granulocytes: 0.03 K/uL (ref 0.00–0.07)
Basophils Absolute: 0.1 K/uL (ref 0.0–0.1)
Basophils Relative: 1 %
Eosinophils Absolute: 0.2 K/uL (ref 0.0–0.5)
Eosinophils Relative: 3 %
HCT: 36.9 % — ABNORMAL LOW (ref 39.0–52.0)
Hemoglobin: 12.5 g/dL — ABNORMAL LOW (ref 13.0–17.0)
Immature Granulocytes: 0 %
Lymphocytes Relative: 10 %
Lymphs Abs: 0.7 K/uL (ref 0.7–4.0)
MCH: 29.8 pg (ref 26.0–34.0)
MCHC: 33.9 g/dL (ref 30.0–36.0)
MCV: 88.1 fL (ref 80.0–100.0)
Monocytes Absolute: 0.5 K/uL (ref 0.1–1.0)
Monocytes Relative: 7 %
Neutro Abs: 5.6 K/uL (ref 1.7–7.7)
Neutrophils Relative %: 79 %
Platelets: 116 K/uL — ABNORMAL LOW (ref 150–400)
RBC: 4.19 MIL/uL — ABNORMAL LOW (ref 4.22–5.81)
RDW: 13.5 % (ref 11.5–15.5)
WBC: 7.1 K/uL (ref 4.0–10.5)
nRBC: 0 % (ref 0.0–0.2)

## 2023-10-05 LAB — PROCALCITONIN: Procalcitonin: 7.28 ng/mL

## 2023-10-05 LAB — URINE CULTURE: Culture: >=100000 — AB

## 2023-10-05 LAB — BASIC METABOLIC PANEL WITH GFR
Anion gap: 8 (ref 5–15)
BUN: 18 mg/dL (ref 8–23)
CO2: 24 mmol/L (ref 22–32)
Calcium: 8.6 mg/dL — ABNORMAL LOW (ref 8.9–10.3)
Chloride: 107 mmol/L (ref 98–111)
Creatinine, Ser: 0.87 mg/dL (ref 0.61–1.24)
GFR, Estimated: >60 mL/min (ref >60)
Glucose, Bld: 88 mg/dL (ref 70–99)
Potassium: 3.7 mmol/L (ref 3.5–5.1)
Sodium: 139 mmol/L (ref 135–145)

## 2023-10-05 MED ORDER — HYDRALAZINE HCL 20 MG/ML IJ SOLN: 10.0000 mg | Freq: Four times a day (QID) | INTRAMUSCULAR | Status: DC | PRN

## 2023-10-05 MED ORDER — TAMSULOSIN HCL 0.4 MG PO CAPS: 0.4000 mg | ORAL_CAPSULE | Freq: Every day | ORAL | 0 refills | Status: DC

## 2023-10-05 MED ORDER — NITROFURANTOIN MONOHYD MACRO 100 MG PO CAPS: 100.0000 mg | ORAL_CAPSULE | Freq: Two times a day (BID) | ORAL | 0 refills | Status: DC

## 2023-10-05 NOTE — Discharge Summary (Signed)
 Physician Discharge Summary  Jason Reese FMW:[id number] DOB: 24-Nov-1940 DOA: 10/02/2023  PCP: Jason Reese [name], MD  Admit date: 10/02/2023 Discharge date: 10/05/2023  Admitted From: Home Disposition:  Home  Recommendations for Outpatient Follow-up:  Follow up with PCP in 1-2 weeks   Home Health:No  Equipment/Devices:None   Discharge Condition:Stable  CODE STATUS:FULL  Diet recommendation: Reg  Brief/Interim Summary:  83 y.o. male with medical history significant of AAA s/p repair, HLD, glucoma, presented with multiple complaints including fever and chills, trouble to urinate, general weakness and fall.   Patient started to feel malaise and general weakness yesterday, started to feel nauseous and vomited couple of times at home and fell and hit his forehead in the toilet while urinating.  Denied any LOC.  Denied any weakness or numbness of the limbs.  He also complained about burning sensation when urinate in the last few days.  At baseline, he has had trouble to urinate for few months, with protein streams and dripping at the end of urination.  Denied any back pain.  Family called EMS, EMS arrived and found patient had a fever of 102F temperature 102, heart rate 114 blood pressure 140/77 O2 saturation 95% on room air.  Chest x-ray showed bilateral atelectasis versus infiltrates.  CT abdomen pelvis that showed no acute infiltrates in lower lungs, no acute finding in abdomen.  UA showed 3+ WBC and 3+ RBC, blood work showed WBC 14.5 hemoglobin 15 COVID-negative BUN 19 creatinine 0.8.  Lactic acid 1.9 > 1.4.  Patient was given ceftriaxone  and azithromycin  in the ED.    Discharge Diagnoses:  Principal Problem:   Sepsis (HCC)  Sepsis, without acute endorgan damage Complicated UTI with hematuria Aerococcus UTI Plan: Discontinue IV Rocephin .  Transition to p.o. Macrobid  to complete total 7-day antibiotic course.  Will continue Flomax .  Patient follow-up with PCP on discharge.  Acute  hypoxia Resolved   Deconditioning and fall- Request therapy evaluations PT evaluation appreciated.  No follow-up recommended   BPH Flomax    Discharge Instructions  Discharge Instructions     Diet - low sodium heart healthy   Complete by: As directed    Increase activity slowly   Complete by: As directed       Allergies as of 10/05/2023   No Known Allergies      Medication List     STOP taking these medications    augmented betamethasone dipropionate 0.05 % ointment Commonly known as: DIPROLENE-AF       TAKE these medications    acetaminophen  500 MG tablet Commonly known as: TYLENOL  Take 500 mg by mouth every 6 (six) hours as needed.   aspirin  EC 81 MG tablet Take 81 mg by mouth.   dorzolamide -timolol  2-0.5 % ophthalmic solution Commonly known as: COSOPT  Place 1 drop into both eyes 2 (two) times daily.   latanoprost  0.005 % ophthalmic solution Commonly known as: XALATAN  Place 1 drop into both eyes at bedtime.   lovastatin 40 MG tablet Commonly known as: MEVACOR Take 40 mg by mouth at bedtime.   nitrofurantoin  (macrocrystal-monohydrate) 100 MG capsule Commonly known as: Macrobid  Take 1 capsule (100 mg total) by mouth 2 (two) times daily for 7 days.   tamsulosin  0.4 MG Caps capsule Commonly known as: FLOMAX  Take 1 capsule (0.4 mg total) by mouth daily after supper.        Follow-up Information     Stoioff, Jason [name], MD Follow up in 1 week(s).   Specialty: Urology Why: BPH Contact information:  [address] [place] [address] [phone]                No Known Allergies  Consultations: None   Procedures/Studies: CT ABDOMEN PELVIS W CONTRAST Result Date: 10/03/2023 EXAM: CT ABDOMEN AND PELVIS WITH CONTRAST 10/03/2023 03:32:59 AM TECHNIQUE: CT of the abdomen and pelvis was performed with the administration of intravenous contrast. Multiplanar reformatted images are provided for review. Automated exposure  control, iterative reconstruction, and/or weight-based adjustment of the mA/kV was utilized to reduce the radiation dose to as low as reasonably achievable. CONTRAST: iohexol  (OMNIPAQUE ) 300 MG/ML solution COMPARISON: 06/21/2017 CLINICAL HISTORY: Abdominal pain, acute, nonlocalized; n/v. Table formatting from the original note was not included.; Images from the original note were not included.; Patient C/O vomiting that began about two hours ago. Additionally, patient was on the toilet when he was shaking and fell. Patient has hematoma to right forehead. FINDINGS: LOWER CHEST: Bibasilar atelectasis. LIVER: Multiple small hypodensities within the liver most compatible with hepatic cysts. GALLBLADDER AND BILE DUCTS: No wall thickening. No cholelithiasis. No biliary ductal dilatation. SPLEEN: Normal size. No focal lesion. PANCREAS: No mass. No ductal dilatation. ADRENAL GLANDS: Normal appearance. No mass. KIDNEYS, URETERS AND BLADDER: Multiple bilateral nonobstructing renal calculi measuring up to 14 mm. No hydronephrosis. No perinephric or periureteral stranding. Urinary bladder is unremarkable. GI AND BOWEL: Intermediate-sized hiatal hernia. Diverticulosis of the sigmoid colon without acute inflammation. There is no bowel obstruction. No bowel wall thickening. PERITONEUM AND RETROPERITONEUM: No ascites. No free air. VASCULATURE: Aorta is normal in caliber. Aortobiiliac stent is patent. LYMPH NODES: No lymphadenopathy. REPRODUCTIVE ORGANS: No significant abnormality. BONES AND SOFT TISSUES: No acute osseous abnormality. No focal soft tissue abnormality. IMPRESSION: 1. No acute findings in the abdomen or pelvis. 2. Intermediate-sized hiatal hernia. Electronically signed by: Franky Stanford MD 10/03/2023 04:05 AM EDT RP Workstation: [id number]   DG Chest Port 1 View Result Date: 10/03/2023 CLINICAL DATA:  Shortness of breath with hypoxia. EXAM: PORTABLE CHEST 1 VIEW COMPARISON:  PA Lat chest 08/18/2014. FINDINGS:  There is a low inspiration. There is patchy opacity in the left-greater-than-right lung bases, which could be due to atelectasis from low lung volumes or could be due to pneumonia. The heart is enlarged. No vascular congestion is seen allowing for low inspiration. The aorta is tortuous and calcified with stable mediastinum. There is no substantial pleural effusion. 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 No new osseous finding. IMPRESSION: 1. Low inspiration with patchy opacity in the left-greater-than-right lung bases, which could be due to atelectasis from low lung volumes or could be due to pneumonia or aspiration. A follow-up study is recommended in full inspiration. 2. Cardiomegaly. 3. Aortic atherosclerosis. Electronically Signed   By: Francis Quam M.D.   On: 10/03/2023 01:36   CT Head Wo Contrast Result Date: 10/03/2023 CLINICAL DATA:  Neck trauma (Age >= 65y); Head trauma, minor (Age >= 65y), vomiting, fall EXAM: CT HEAD WITHOUT CONTRAST CT CERVICAL SPINE WITHOUT CONTRAST TECHNIQUE: Multidetector CT imaging of the head and cervical spine was performed following the standard protocol without intravenous contrast. Multiplanar CT image reconstructions of the cervical spine were also generated. RADIATION DOSE REDUCTION: This exam was performed according to the departmental dose-optimization program which includes automated exposure control, adjustment of the mA and/or kV according to patient size and/or use of iterative reconstruction technique. COMPARISON:  None Available. FINDINGS: CT HEAD FINDINGS Brain: Normal anatomic configuration. Parenchymal volume loss is commensurate with  the patient's age. Mild periventricular white matter changes are present likely reflecting the sequela of small vessel ischemia. 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 Tiny remote lacunar infarct within the left pons. No abnormal intra or  extra-axial mass lesion or fluid collection. No abnormal mass effect or midline shift. No evidence of acute intracranial hemorrhage or infarct. Ventricular size is normal. Cerebellum unremarkable. Vascular: No asymmetric hyperdense vasculature at the skull base. Skull: Intact Sinuses/Orbits: Paranasal sinuses are clear. Orbits are unremarkable. Other: Mastoid air cells and middle ear cavities are clear. Moderate right frontal scalp hematoma. CT CERVICAL SPINE FINDINGS Alignment: 2 mm retrolisthesis C3-4 and C4-5. Skull base and vertebrae: Craniocervical alignment is normal. The atlantodental interval is not widened. No acute fracture of the cervical spine Soft tissues and spinal canal: No prevertebral fluid or swelling. No visible canal hematoma. Disc levels: Disc space narrowing, endplate remodeling, subchondral cyst formation noted throughout the cervical spine in keeping with changes of advanced degenerative disc disease, most severe at C3-4 and C5-6. Posterior disc osteophyte complex at C3-4 in combination with retrolisthesis results in moderate central canal stenosis with an AP diameter of the spinal canal approximately 5-6 mm and flattening of the thecal sac. Milder changes are seen at C4-5. Multilevel uncovertebral and facet arthrosis results in multilevel moderate to severe neuroforaminal narrowing most severe on the right at C4-5 and C5-6 and the left at C3-4 C4-5 and C5-6. Upper chest: Severe emphysema Other: Aneurysm the thoracic aorta, incompletely evaluated on this examination measuring at least 3.9 cm at the arch. IMPRESSION: 1. No acute intracranial abnormality. No calvarial fracture. Moderate right frontal scalp hematoma. 2. No acute fracture or listhesis of the cervical spine. 3. 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 4. Aneurysm of the thoracic  aorta, incompletely evaluated on this examination measuring at least 3.9 cm at the arch. If indicated, this would be better assessed with dedicated CT arterial 5. Emphysema. Emphysema (ICD10-J43.9). Electronically Signed   By: Dorethia Molt M.D.   On: 10/03/2023 00:32   CT Cervical Spine Wo Contrast Result Date: 10/03/2023 CLINICAL DATA:  Neck trauma (Age >= 65y); Head trauma, minor (Age >= 65y), vomiting, fall EXAM: CT HEAD WITHOUT CONTRAST CT CERVICAL SPINE WITHOUT CONTRAST TECHNIQUE: Multidetector CT imaging of the head and cervical spine was performed following the standard protocol without intravenous contrast. Multiplanar CT image reconstructions of the cervical spine were also generated. RADIATION DOSE REDUCTION: This exam was performed according to the departmental dose-optimization program which includes automated exposure control, adjustment of the mA and/or kV according to patient size and/or use of iterative reconstruction technique. COMPARISON:  None Available. FINDINGS: CT HEAD FINDINGS Brain: Normal anatomic configuration. Parenchymal volume loss is commensurate with the patient's age. Mild periventricular white matter changes are present likely reflecting the sequela of small vessel ischemia. 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 Tiny remote lacunar infarct within the left pons. No abnormal intra or extra-axial mass lesion or fluid collection. No abnormal mass effect or midline shift. No evidence of acute intracranial hemorrhage or infarct. Ventricular size is normal. Cerebellum unremarkable. Vascular: No asymmetric hyperdense vasculature at the skull base. Skull: Intact Sinuses/Orbits: Paranasal sinuses are clear. Orbits are unremarkable. Other: Mastoid air cells and middle ear cavities are clear. Moderate right frontal scalp hematoma. CT CERVICAL SPINE FINDINGS Alignment: 2 mm  retrolisthesis C3-4 and C4-5. Skull base and vertebrae: Craniocervical  alignment is normal. The atlantodental interval is not widened. No acute fracture of the cervical spine Soft tissues and spinal canal: No prevertebral fluid or swelling. No visible canal hematoma. Disc levels: Disc space narrowing, endplate remodeling, subchondral cyst formation noted throughout the cervical spine in keeping with changes of advanced degenerative disc disease, most severe at C3-4 and C5-6. Posterior disc osteophyte complex at C3-4 in combination with retrolisthesis results in moderate central canal stenosis with an AP diameter of the spinal canal approximately 5-6 mm and flattening of the thecal sac. Milder changes are seen at C4-5. Multilevel uncovertebral and facet arthrosis results in multilevel moderate to severe neuroforaminal narrowing most severe on the right at C4-5 and C5-6 and the left at C3-4 C4-5 and C5-6. Upper chest: Severe emphysema Other: Aneurysm the thoracic aorta, incompletely evaluated on this examination measuring at least 3.9 cm at the arch. IMPRESSION: 1. No acute intracranial abnormality. No calvarial fracture. Moderate right frontal scalp hematoma. 2. No acute fracture or listhesis of the cervical spine. 3. 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 4. Aneurysm of the thoracic aorta, incompletely evaluated on this examination measuring at least 3.9 cm at the arch. If indicated, this would be better assessed with dedicated CT arterial 5. Emphysema. Emphysema (ICD10-J43.9). Electronically Signed   By: Dorethia Molt M.D.   On: 10/03/2023 00:32      Subjective: Seen and examined on the day of discharge.  Stable no distress.  Appropriate for discharge home.  Discharge Exam: Vitals:   10/05/23 0758 10/05/23 1016  BP: (!) 175/63 (!) 167/89  Pulse: (!) 48 (!) 55  Resp: 16 18  Temp: 97.7 F (36.5 C)   SpO2: 97%     Vitals:   10/04/23 1943 10/05/23 0401 10/05/23 0758 10/05/23 1016  BP: (!) 170/71 (!) 156/62 (!) 175/63 (!) 167/89  Pulse: (!) 56 (!) 54 (!) 48 (!) 55  Resp: 18 16 16 18   Temp: 97.8 F (36.6 C) 97.8 F (36.6 C) 97.7 F (36.5 C)   TempSrc: Oral Oral Oral   SpO2: 95% 96% 97%   Weight:      Height:        General: Pt is alert, awake, not in acute distress Cardiovascular: RRR, S1/S2 +, no rubs, no gallops Respiratory: CTA bilaterally, no wheezing, no rhonchi Abdominal: Soft, NT, ND, bowel sounds + Extremities: no edema, no cyanosis    The results of significant diagnostics from this hospitalization (including imaging, microbiology, ancillary and laboratory) are listed below for reference.     Microbiology: Recent Results (from the past 240 hours)  Resp panel by RT-PCR (RSV, Flu A&B, Covid) Anterior Nasal Swab     Status: None   Collection Time: 10/02/23 11:49 PM   Specimen: Anterior Nasal Swab  Result Value Ref Range Status   SARS Coronavirus 2 by RT PCR NEGATIVE NEGATIVE Final    Comment: (NOTE) SARS-CoV-2 target nucleic acids are NOT DETECTED.  The SARS-CoV-2 RNA is generally detectable in upper respiratory specimens during the acute phase of infection. The lowest concentration of SARS-CoV-2 viral copies this assay can detect is 138 copies/mL. A negative result does not preclude SARS-Cov-2 infection and should not be used as the sole basis for treatment or other patient management decisions. A negative result may occur with  improper specimen collection/handling, submission of specimen other  than nasopharyngeal swab, presence of viral mutation(s) within the areas targeted by this assay, and inadequate number of viral copies(<138 copies/mL). A negative result must be combined with clinical observations, patient history, and epidemiological information. The expected result is Negative.  Fact Sheet for Patients:  BloggerCourse.com  Fact Sheet  for Healthcare Providers:  SeriousBroker.it  This test is no t yet approved or cleared by the [place]  FDA and  has been authorized for detection and/or diagnosis of SARS-CoV-2 by FDA under an Emergency Use Authorization (EUA). This EUA will remain  in effect (meaning this test can be used) for the duration of the COVID-19 declaration under Section 564(b)(1) of the Act, 21 U.S.C.section 360bbb-3(b)(1), unless the authorization is terminated  or revoked sooner.       Influenza A by PCR NEGATIVE NEGATIVE Final   Influenza B by PCR NEGATIVE NEGATIVE Final    Comment: (NOTE) The Xpert Xpress SARS-CoV-2/FLU/RSV plus assay is intended as an aid in the diagnosis of influenza from Nasopharyngeal swab specimens and should not be used as a sole basis for treatment. Nasal washings and aspirates are unacceptable for Xpert Xpress SARS-CoV-2/FLU/RSV testing.  Fact Sheet for Patients: BloggerCourse.com  Fact Sheet for Healthcare Providers: SeriousBroker.it  This test is not yet approved or cleared by the [place]  FDA and has been authorized for detection and/or diagnosis of SARS-CoV-2 by FDA under an Emergency Use Authorization (EUA). This EUA will remain in effect (meaning this test can be used) for the duration of the COVID-19 declaration under Section 564(b)(1) of the Act, 21 U.S.C. section 360bbb-3(b)(1), unless the authorization is terminated or revoked.     Resp Syncytial Virus by PCR NEGATIVE NEGATIVE Final    Comment: (NOTE) Fact Sheet for Patients: BloggerCourse.com  Fact Sheet for Healthcare Providers: SeriousBroker.it  This test is not yet approved or cleared by the [place]  FDA and has been authorized for detection and/or diagnosis of SARS-CoV-2 by FDA under an Emergency Use Authorization (EUA). This EUA will remain in effect (meaning  this test can be used) for the duration of the COVID-19 declaration under Section 564(b)(1) of the Act, 21 U.S.C. section 360bbb-3(b)(1), unless the authorization is terminated or revoked.  Performed at [facility], [address], [place], [place] [address]   Urine Culture (for pregnant, neutropenic or urologic patients or patients with an indwelling urinary catheter)     Status: Abnormal   Collection Time: 10/02/23 11:49 PM   Specimen: Urine, Clean Catch  Result Value Ref Range Status   Specimen Description   Final    URINE, CLEAN CATCH Performed at [facility], [address], [place], [place] [address]    Special Requests   Final    NONE Performed at [facility], [address], [place], [place] [address]    Culture (A)  Final    >=100,000 COLONIES/mL AEROCOCCUS SPECIES Standardized susceptibility testing for this organism is not available. Performed at [facility], [address], [place], [place] [address]    Report Status 10/05/2023 FINAL  Final     Labs: BNP (last 3 results) No results for input(s): BNP in the last 8760 hours. Basic Metabolic Panel: Recent Labs  Lab 10/02/23 2349 10/05/23 0913  NA 144 139  K 3.7 3.7  CL 108 107  CO2 21* 24  GLUCOSE 139* 88  BUN 19 18  CREATININE 0.86 0.87  CALCIUM 9.2 8.6*   Liver Function Tests: Recent Labs  Lab 10/02/23 2349  AST  23  ALT 10  ALKPHOS 61  BILITOT 0.7  PROT 7.0  ALBUMIN 3.8   Recent Labs  Lab 10/02/23 2349  LIPASE 38   No results for input(s): AMMONIA in the last 168 hours. CBC: Recent Labs  Lab 10/02/23 2349 10/04/23 0416 10/05/23 0913  WBC 11.4* 10.8* 7.1  NEUTROABS 10.7*  --  5.6  HGB 15.0 12.7* 12.5*  HCT 45.4 39.2 36.9*  MCV 89.4 91.4 88.1  PLT 133* 110* 116*   Cardiac Enzymes: No results for input(s): CKTOTAL, CKMB, CKMBINDEX, TROPONINI in the last 168 hours. BNP: Invalid input(s): POCBNP CBG: No results for input(s):  GLUCAP in the last 168 hours. D-Dimer No results for input(s): DDIMER in the last 72 hours. Hgb A1c No results for input(s): HGBA1C in the last 72 hours. Lipid Profile No results for input(s): CHOL, HDL, LDLCALC, TRIG, CHOLHDL, LDLDIRECT in the last 72 hours. Thyroid function studies No results for input(s): TSH, T4TOTAL, T3FREE, THYROIDAB in the last 72 hours.  Invalid input(s): FREET3 Anemia work up No results for input(s): VITAMINB12, FOLATE, FERRITIN, TIBC, IRON, RETICCTPCT in the last 72 hours. Urinalysis    Component Value Date/Time   COLORURINE YELLOW (A) 10/02/2023 2349   APPEARANCEUR CLOUDY (A) 10/02/2023 2349   LABSPEC 1.009 10/02/2023 2349   PHURINE 6.0 10/02/2023 2349   GLUCOSEU NEGATIVE 10/02/2023 2349   HGBUR LARGE (A) 10/02/2023 2349   BILIRUBINUR NEGATIVE 10/02/2023 2349   KETONESUR NEGATIVE 10/02/2023 2349   PROTEINUR NEGATIVE 10/02/2023 2349   NITRITE NEGATIVE 10/02/2023 2349   LEUKOCYTESUR MODERATE (A) 10/02/2023 2349   Sepsis Labs Recent Labs  Lab 10/02/23 2349 10/04/23 0416 10/05/23 0913  WBC 11.4* 10.8* 7.1   Microbiology Recent Results (from the past 240 hours)  Resp panel by RT-PCR (RSV, Flu A&B, Covid) Anterior Nasal Swab     Status: None   Collection Time: 10/02/23 11:49 PM   Specimen: Anterior Nasal Swab  Result Value Ref Range Status   SARS Coronavirus 2 by RT PCR NEGATIVE NEGATIVE Final    Comment: (NOTE) SARS-CoV-2 target nucleic acids are NOT DETECTED.  The SARS-CoV-2 RNA is generally detectable in upper respiratory specimens during the acute phase of infection. The lowest concentration of SARS-CoV-2 viral copies this assay can detect is 138 copies/mL. A negative result does not preclude SARS-Cov-2 infection and should not be used as the sole basis for treatment or other patient management decisions. A negative result may occur with  improper specimen collection/handling, submission of specimen  other than nasopharyngeal swab, presence of viral mutation(s) within the areas targeted by this assay, and inadequate number of viral copies(<138 copies/mL). A negative result must be combined with clinical observations, patient history, and epidemiological information. The expected result is Negative.  Fact Sheet for Patients:  BloggerCourse.com  Fact Sheet for Healthcare Providers:  SeriousBroker.it  This test is no t yet approved or cleared by the [place]  FDA and  has been authorized for detection and/or diagnosis of SARS-CoV-2 by FDA under an Emergency Use Authorization (EUA). This EUA will remain  in effect (meaning this test can be used) for the duration of the COVID-19 declaration under Section 564(b)(1) of the Act, 21 U.S.C.section 360bbb-3(b)(1), unless the authorization is terminated  or revoked sooner.       Influenza A by PCR NEGATIVE NEGATIVE Final   Influenza B by PCR NEGATIVE NEGATIVE Final    Comment: (NOTE) The Xpert Xpress SARS-CoV-2/FLU/RSV plus assay is intended as an aid in the diagnosis of influenza from  Nasopharyngeal swab specimens and should not be used as a sole basis for treatment. Nasal washings and aspirates are unacceptable for Xpert Xpress SARS-CoV-2/FLU/RSV testing.  Fact Sheet for Patients: BloggerCourse.com  Fact Sheet for Healthcare Providers: SeriousBroker.it  This test is not yet approved or cleared by the [place]  FDA and has been authorized for detection and/or diagnosis of SARS-CoV-2 by FDA under an Emergency Use Authorization (EUA). This EUA will remain in effect (meaning this test can be used) for the duration of the COVID-19 declaration under Section 564(b)(1) of the Act, 21 U.S.C. section 360bbb-3(b)(1), unless the authorization is terminated or revoked.     Resp Syncytial Virus by PCR NEGATIVE NEGATIVE Final     Comment: (NOTE) Fact Sheet for Patients: BloggerCourse.com  Fact Sheet for Healthcare Providers: SeriousBroker.it  This test is not yet approved or cleared by the [place]  FDA and has been authorized for detection and/or diagnosis of SARS-CoV-2 by FDA under an Emergency Use Authorization (EUA). This EUA will remain in effect (meaning this test can be used) for the duration of the COVID-19 declaration under Section 564(b)(1) of the Act, 21 U.S.C. section 360bbb-3(b)(1), unless the authorization is terminated or revoked.  Performed at [facility], [address] [place], [place] [address]   Urine Culture (for pregnant, neutropenic or urologic patients or patients with an indwelling urinary catheter)     Status: Abnormal   Collection Time: 10/02/23 11:49 PM   Specimen: Urine, Clean Catch  Result Value Ref Range Status   Specimen Description   Final    URINE, CLEAN CATCH Performed at [facility], [address] [place], [place] [address]    Special Requests   Final    NONE Performed at [facility], [address], [place], [place] [address]    Culture (A)  Final    >=100,000 COLONIES/mL AEROCOCCUS SPECIES Standardized susceptibility testing for this organism is not available. Performed at [facility], [address] [place], [place] [address]    Report Status 10/05/2023 FINAL  Final     Time coordinating discharge: 40 minutes  SIGNED:   Calvin KATHEE Robson, MD  [facility] 10/05/2023, 1:06 PM Pager   If 7PM-7AM, please contact night-coverage

## 2023-10-05 NOTE — Care Plan (Signed)
 Patient cleared to DC by LCSW and MD.  IV removed, belongings packed.  DC instructions given to patient and wife at bedside  No other needs at this time

## 2023-10-05 NOTE — TOC CM/SW Note (Signed)
 Transition of Care [facility]) - Inpatient Brief Assessment   Patient Details  Name: Jason Reese MRN: [id number] Date of Birth: [birth year]  Transition of Care [facility]) CM/SW Contact:    Tellis Spivak E Favian Kittleson, LCSW Phone Number: 10/05/2023, 8:35 AM   Clinical Narrative: From home with spouse. PT has assessed and no recs. Please consult Inpatient Care Management if needs arise.   Transition of Care Asessment: Insurance and Status: Insurance coverage has been reviewed Patient has primary care physician: Yes     Prior/Current Home Services: No current home services Social Drivers of Health Review: SDOH reviewed no interventions necessary Readmission risk has been reviewed: Yes Transition of care needs: no transition of care needs at this time

## 2023-10-05 NOTE — Plan of Care (Signed)

## 2023-10-05 NOTE — Plan of Care (Signed)
  Problem: Education: Goal: Knowledge of General Education information will improve Description: Including pain rating scale, medication(s)/side effects and non-pharmacologic comfort measures 10/05/2023 1304 by Lawanna Sprawls, RN Outcome: Adequate for Discharge 10/05/2023 0932 by Lawanna Sprawls, RN Outcome: Progressing   Problem: Health Behavior/Discharge Planning: Goal: Ability to manage health-related needs will improve 10/05/2023 1304 by Lawanna Sprawls, RN Outcome: Adequate for Discharge 10/05/2023 0932 by Lawanna Sprawls, RN Outcome: Progressing   Problem: Clinical Measurements: Goal: Ability to maintain clinical measurements within normal limits will improve 10/05/2023 1304 by Lawanna Sprawls, RN Outcome: Adequate for Discharge 10/05/2023 0932 by Lawanna Sprawls, RN Outcome: Progressing Goal: Will remain free from infection 10/05/2023 1304 by Lawanna Sprawls, RN Outcome: Adequate for Discharge 10/05/2023 0932 by Lawanna Sprawls, RN Outcome: Progressing Goal: Diagnostic test results will improve 10/05/2023 1304 by Lawanna Sprawls, RN Outcome: Adequate for Discharge 10/05/2023 0932 by Lawanna Sprawls, RN Outcome: Progressing Goal: Respiratory complications will improve 10/05/2023 1304 by Lawanna Sprawls, RN Outcome: Adequate for Discharge 10/05/2023 0932 by Lawanna Sprawls, RN Outcome: Progressing Goal: Cardiovascular complication will be avoided 10/05/2023 1304 by Lawanna Sprawls, RN Outcome: Adequate for Discharge 10/05/2023 0932 by Lawanna Sprawls, RN Outcome: Progressing   Problem: Activity: Goal: Risk for activity intolerance will decrease 10/05/2023 1304 by Lawanna Sprawls, RN Outcome: Adequate for Discharge 10/05/2023 0932 by Lawanna Sprawls, RN Outcome: Progressing   Problem: Nutrition: Goal: Adequate nutrition will be maintained 10/05/2023 1304 by Lawanna Sprawls, RN Outcome: Adequate for Discharge 10/05/2023 0932 by Lawanna Sprawls, RN Outcome: Progressing   Problem: Coping: Goal: Level of anxiety will  decrease 10/05/2023 1304 by Lawanna Sprawls, RN Outcome: Adequate for Discharge 10/05/2023 0932 by Lawanna Sprawls, RN Outcome: Progressing   Problem: Elimination: Goal: Will not experience complications related to bowel motility 10/05/2023 1304 by Lawanna Sprawls, RN Outcome: Adequate for Discharge 10/05/2023 0932 by Lawanna Sprawls, RN Outcome: Progressing Goal: Will not experience complications related to urinary retention 10/05/2023 1304 by Lawanna Sprawls, RN Outcome: Adequate for Discharge 10/05/2023 0932 by Lawanna Sprawls, RN Outcome: Progressing   Problem: Pain Managment: Goal: General experience of comfort will improve and/or be controlled 10/05/2023 1304 by Lawanna Sprawls, RN Outcome: Adequate for Discharge 10/05/2023 0932 by Lawanna Sprawls, RN Outcome: Progressing   Problem: Safety: Goal: Ability to remain free from injury will improve 10/05/2023 1304 by Lawanna Sprawls, RN Outcome: Adequate for Discharge 10/05/2023 0932 by Lawanna Sprawls, RN Outcome: Progressing   Problem: Skin Integrity: Goal: Risk for impaired skin integrity will decrease 10/05/2023 1304 by Lawanna Sprawls, RN Outcome: Adequate for Discharge 10/05/2023 0932 by Lawanna Sprawls, RN Outcome: Progressing   Problem: Acute Rehab PT Goals(only PT should resolve) Goal: Patient Will Transfer Sit To/From Stand Outcome: Adequate for Discharge Goal: Pt Will Ambulate Outcome: Adequate for Discharge Goal: Pt Will Go Up/Down Stairs Outcome: Adequate for Discharge

## 2023-10-09 DIAGNOSIS — N390 Urinary tract infection, site not specified: Secondary | ICD-10-CM | POA: Insufficient documentation

## 2023-10-09 DIAGNOSIS — N138 Other obstructive and reflux uropathy: Secondary | ICD-10-CM | POA: Insufficient documentation

## 2023-10-09 NOTE — Assessment & Plan Note (Addendum)
 Progressive symptoms  PVR 73cc today Recent UTI  - Continue recently prescribed Flomax , will refill as long-term medication -Close expectant management for now, will evaluate prostate and bladder anatomy with upcoming cystoscopy ureteroscopy

## 2023-10-09 NOTE — Assessment & Plan Note (Addendum)
 Recent UTI w/ sepsis, requiring admission  Progressive LUTS and voiding dysfunction  PVR 73cc today  Unclear etiology, first UTI.  Will continue to follow closely.  Emptying well today. -Continue Flomax , emphasized bladder hygiene, avoidance of constipation, timed and double voiding

## 2023-10-09 NOTE — Progress Notes (Signed)
 10/18/23 8:30 AM   Jason Reese 1940-12-16 [id number]  CC: UTI + LUTS   HPI: 83 year old male here for initial evaluation of  Admitted 10/02/23 - N/V, weakness with ground level fall, while urinating    - Found to have 100k Aerococcus UTI, s/p rocephin  --> Macrobid  x7d course Several month history of progressive LUTS, emptying   CT A/P w/ con (10/03/23) -  KIDNEYS, URETERS AND BLADDER: Multiple bilateral nonobstructing renal calculi measuring up to 14 mm. No hydronephrosis. No perinephric or periureteral stranding. Urinary bladder is unremarkable.  Today patient is accompanied by his wife He is feeling much better, completed antibiotics, continues on Flomax  Reported history of nephrolithiasis, prior ESWL and several past stones  Hx of AAA s/p repair, HLD, glucoma History of recent bradycardia (asymptomatic by my interview today) -but referred to cardiology for ongoing workup   - Denies angina at rest, denies shortness of breath, reports>    PMH: Past Medical History:  Diagnosis Date   Aneurysm (HCC)    Cataract    Glaucoma    Hard of hearing    History of kidney stones    Hyperlipidemia    PONV (postoperative nausea and vomiting)    Wears hearing aid in both ears     Surgical History: Past Surgical History:  Procedure Laterality Date   CATARACT EXTRACTION W/PHACO Right 05/16/2020   Procedure: CATARACT EXTRACTION PHACO AND INTRAOCULAR LENS PLACEMENT (IOC) RIGHT;  Surgeon: Myrna Adine Anes, MD;  Location: [facility];  Service: Ophthalmology;  Laterality: Right;  9.39 0:58.0   CATARACT EXTRACTION W/PHACO Left 05/30/2020   Procedure: CATARACT EXTRACTION PHACO AND INTRAOCULAR LENS PLACEMENT (IOC) LEFT;  Surgeon: Myrna Adine Anes, MD;  Location: [facility];  Service: Ophthalmology;  Laterality: Left;  9.04 0:58.7   ENDOVASCULAR REPAIR/STENT GRAFT N/A 07/24/2017   Procedure: ENDOVASCULAR REPAIR/STENT GRAFT;  Surgeon: Marea Selinda RAMAN, MD;  Location:  [facility];  Service: Cardiovascular;  Laterality: N/A;   EYE SURGERY  2013   INGUINAL HERNIA REPAIR Right 08/25/2014   Procedure: RIGHT INGUINAL HERNIA REPAIR WITH MESH ;  Surgeon: Lonni Brands, MD;  Location: [facility] OR[facility];  Service: General;  Laterality: Right;   INGUINAL HERNIA REPAIR Left 1980   x 2- Dr. Brendan   INGUINAL HERNIA REPAIR Right    JOINT REPLACEMENT Left 2007   shoulder   SHOULDER ARTHROSCOPY Left     Family History: Family History  Problem Relation Age of Onset   Diabetes Mother     Social History:  reports that he quit smoking about 18 years ago. His smoking use included cigarettes. He has never used smokeless tobacco. He reports that he does not drink alcohol and does not use drugs.      Physical Exam: BP (!) 178/69   Pulse (!) 38   Ht 5' 6 (1.676 m)   Wt 162 lb (73.5 kg)   BMI 26.15 kg/m    Constitutional:  Alert and oriented, No acute distress. Cardiovascular: No clubbing, cyanosis, or edema. Respiratory: Normal respiratory effort, no increased work of breathing. GI: Nondistended Skin: No rashes, bruises or suspicious lesions. Neurologic: Grossly intact, no focal deficits, moving all 4 extremities. Psychiatric: Normal mood and affect.  Laboratory Data:  Latest Reference Range & Units 10/05/23 09:13  Creatinine 0.61 - 1.24 mg/dL 9.12     Abnormal  >=899,999 COLONIES/mL AEROCOCCUS SPECIES Standardized susceptibility testing for this organism is not available. Performed at [facility], [address] [place], [place] [address]    Report Status 10/05/2023 FINAL    Pertinent Imaging: I have personally viewed and interpreted the CT A/P with con (10/03/2023)-bilateral nonobstructive renal stones, right equals upper and lower poles measuring up to 1.4 cm, left equals upper pole measuring up to 1.5 cm.  Bilateral simple renal cysts, benign.  No hydronephrosis.  Normal appearing bladder, small to average size prostate.      Assessment & Plan:    Acute cystitis without hematuria Assessment & Plan: Recent UTI w/ sepsis, requiring admission  Progressive LUTS and voiding dysfunction  PVR 73cc today  Unclear etiology, first UTI.  Will continue to follow closely.  Emptying well today. -Continue Flomax , emphasized bladder hygiene, avoidance of constipation, timed and double voiding  Orders: -     Urinalysis, Complete -     BLADDER SCAN AMB NON-IMAGING  BPH with obstruction/lower urinary tract symptoms Assessment & Plan: Progressive symptoms  PVR 73cc today Recent UTI  - Continue recently prescribed Flomax , will refill as long-term medication -Close expectant management for now, will evaluate prostate and bladder anatomy with upcoming cystoscopy ureteroscopy  Orders: -     Urinalysis, Complete -     BLADDER SCAN AMB NON-IMAGING -     Tamsulosin  HCl; Take 1 capsule (0.4 mg total) by mouth daily after supper.  Dispense: 90 capsule; Refill: 3  Nephrolithiasis Assessment & Plan: Bilateral nonobstructive renal stones Measuring up to 1.4-1.5 cm History of recent UTI  I reviewed his imaging with him today.  He does appear to have significant stone burden bilaterally.  I suspect localized cystitis may be secondary to incomplete emptying, although infectious stone nidus is a possibility.  While not urgent, I did review the role of preemptive ureteroscopy/laser lithotripsy for definitive stone treatment.  For completeness, I I also reviewed the ESWL procedure (which he has had before)-this may be much less effective considering his stone size, multiple locations and HU ~1500.   - Recommend follow-up with cardiology and PCM, clearance from cardiopulmonary standpoint -Tentative plan for preemptive bilateral ureteroscopy laser lithotripsy in the future - Return to see me in 4 weeks for preop and review of cardiac records  Orders: -     Urinalysis, Complete      Penne Skye, MD 10/18/2023  [facility]  Urology [address] [place], [place] [address] [phone]

## 2023-10-18 ENCOUNTER — Ambulatory Visit: Admitting: Urology

## 2023-10-18 VITALS — BP 178/69 | HR 38 | Ht 66.0 in | Wt 162.0 lb

## 2023-10-18 DIAGNOSIS — N138 Other obstructive and reflux uropathy: Secondary | ICD-10-CM | POA: Diagnosis not present

## 2023-10-18 DIAGNOSIS — N3000 Acute cystitis without hematuria: Secondary | ICD-10-CM | POA: Diagnosis not present

## 2023-10-18 DIAGNOSIS — N401 Enlarged prostate with lower urinary tract symptoms: Secondary | ICD-10-CM

## 2023-10-18 DIAGNOSIS — N200 Calculus of kidney: Secondary | ICD-10-CM | POA: Diagnosis not present

## 2023-10-18 LAB — URINALYSIS, COMPLETE
Bilirubin, UA: NEGATIVE
Glucose, UA: NEGATIVE
Ketones, UA: NEGATIVE
Leukocytes,UA: NEGATIVE
Nitrite, UA: NEGATIVE
Protein,UA: NEGATIVE
RBC, UA: NEGATIVE
Specific Gravity, UA: 1.015 (ref 1.005–1.030)
Urobilinogen, Ur: 1 mg/dL (ref 0.2–1.0)
pH, UA: 8 — ABNORMAL HIGH (ref 5.0–7.5)

## 2023-10-18 LAB — BLADDER SCAN AMB NON-IMAGING: Scan Result: 73

## 2023-10-18 LAB — MICROSCOPIC EXAMINATION

## 2023-10-18 MED ORDER — TAMSULOSIN HCL 0.4 MG PO CAPS
0.4000 mg | ORAL_CAPSULE | Freq: Every day | ORAL | 3 refills | Status: AC
Start: 2023-10-18 — End: ?

## 2023-10-18 NOTE — Assessment & Plan Note (Addendum)
 Bilateral nonobstructive renal stones Measuring up to 1.4-1.5 cm History of recent UTI  I reviewed his imaging with him today.  He does appear to have significant stone burden bilaterally.  I suspect localized cystitis may be secondary to incomplete emptying, although infectious stone nidus is a possibility.  While not urgent, I did review the role of preemptive ureteroscopy/laser lithotripsy for definitive stone treatment.  For completeness, I I also reviewed the ESWL procedure (which he has had before)-this may be much less effective considering his stone size, multiple locations and HU ~1500.   - Recommend follow-up with cardiology and PCM, clearance from cardiopulmonary standpoint -Tentative plan for preemptive bilateral ureteroscopy laser lithotripsy in the future - Return to see me in 4 weeks for preop and review of cardiac records

## 2023-10-28 ENCOUNTER — Ambulatory Visit: Attending: Cardiology | Admitting: Cardiology

## 2023-10-28 VITALS — BP 182/74 | HR 53 | Ht 66.0 in | Wt 162.8 lb

## 2023-10-28 DIAGNOSIS — I2510 Atherosclerotic heart disease of native coronary artery without angina pectoris: Secondary | ICD-10-CM

## 2023-10-28 DIAGNOSIS — Z01810 Encounter for preprocedural cardiovascular examination: Secondary | ICD-10-CM | POA: Diagnosis not present

## 2023-10-28 DIAGNOSIS — I10 Essential (primary) hypertension: Secondary | ICD-10-CM

## 2023-10-28 MED ORDER — AMLODIPINE BESYLATE 5 MG PO TABS: 2.5000 mg | ORAL_TABLET | Freq: Every day | ORAL | 3 refills | Status: DC

## 2023-10-28 NOTE — Progress Notes (Signed)
 Cardiology Office Note:    Date:  10/28/2023   ID:  Jason Reese, DOB February 09, 1940, MRN [id number]  PCP:  Odell Chard, Edra GRADE, MD   [facility] Providers Cardiologist:  None     Referring MD: Odell Chard, Consuelo *   No chief complaint on file.   History of Present Illness:    [name] is a 83 y.o. 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  Last seen in the office in 2019 for preop evaluation prior to endovascular stent placement.  Patient was recently admitted to the hospital with diagnosis of UTI, renal stones.  Lithotripsy is being planned in the near future.  He denies chest pain or shortness of breath.  Not sure what he takes for BP.  Past Medical History:  Diagnosis Date   Aneurysm    Cataract    Glaucoma    Hard of hearing    History of kidney stones    Hyperlipidemia    PONV (postoperative nausea and vomiting)    Wears hearing aid in both ears     Past Surgical History:  Procedure Laterality Date   CATARACT EXTRACTION W/PHACO Right 05/16/2020   Procedure: CATARACT EXTRACTION PHACO AND INTRAOCULAR LENS PLACEMENT (IOC) RIGHT;  Surgeon: Myrna Adine Anes, MD;  Location: [facility];  Service: Ophthalmology;  Laterality: Right;  9.39 0:58.0   CATARACT EXTRACTION W/PHACO Left 05/30/2020   Procedure: CATARACT EXTRACTION PHACO AND INTRAOCULAR LENS PLACEMENT (IOC) LEFT;  Surgeon: Myrna Adine Anes, MD;  Location: [facility];  Service: Ophthalmology;  Laterality: Left;  9.04 0:58.7   ENDOVASCULAR STENT GRAFT (AAA) N/A 07/24/2017   Procedure: ENDOVASCULAR REPAIR/STENT GRAFT;  Surgeon: Marea Selinda RAMAN, MD;  Location: [facility];  Service: Cardiovascular;  Laterality: N/A;   EYE SURGERY  2013   INGUINAL HERNIA REPAIR Right 08/25/2014   Procedure: RIGHT INGUINAL HERNIA REPAIR WITH MESH ;  Surgeon:  Lonni Brands, MD;  Location: [facility];  Service: General;  Laterality: Right;   INGUINAL HERNIA REPAIR Left 1980   x 2- Dr. Brendan   INGUINAL HERNIA REPAIR Right    JOINT REPLACEMENT Left 2007   shoulder   SHOULDER ARTHROSCOPY Left     Current Medications: Current Meds  Medication Sig   acetaminophen  (TYLENOL ) 500 MG tablet Take 500 mg by mouth every 6 (six) hours as needed.    aspirin  EC 81 MG tablet Take 81 mg by mouth.   dorzolamide -timolol  (COSOPT ) 22.3-6.8 MG/ML ophthalmic solution Place 1 drop into both eyes 2 (two) times daily.   latanoprost  (XALATAN ) 0.005 % ophthalmic solution Place 1 drop into both eyes at bedtime.   lovastatin (MEVACOR) 40 MG tablet Take 40 mg by mouth at bedtime.   tamsulosin  (FLOMAX ) 0.4 MG CAPS capsule Take 1 capsule (0.4 mg total) by mouth daily after supper.   [DISCONTINUED] amLODipine (NORVASC) 2.5 MG tablet Take 2.5 mg by mouth daily.     Allergies:   Patient has no known allergies.   Social History   Socioeconomic History   Marital status: Married    Spouse name: Not on file   Number of children: Not on file   Years of education: Not on file   Highest education level: Not on file  Occupational History   Not on file  Tobacco Use   Smoking status: Former    Current packs/day:  0.00    Types: Cigarettes    Quit date: 07/28/2005    Years since quitting: 18.2   Smokeless tobacco: Never  Vaping Use   Vaping status: Never Used  Substance and Sexual Activity   Alcohol use: No   Drug use: No   Sexual activity: Not on file  Other Topics Concern   Not on file  Social History Narrative   Not on file   Social Drivers of Health   Financial Resource Strain: Not on file  Food Insecurity: No Food Insecurity (10/03/2023)   Hunger Vital Sign    Worried About Running Out of Food in the Last Year: Never true    Ran Out of Food in the Last Year: Never true  Transportation Needs: No Transportation Needs (10/03/2023)   PRAPARE -  Administrator, Civil Service (Medical): No    Lack of Transportation (Non-Medical): No  Physical Activity: Not on file  Stress: Not on file  Social Connections: Moderately Integrated (10/03/2023)   Social Connection and Isolation Panel    Frequency of Communication with Friends and Family: Three times a week    Frequency of Social Gatherings with Friends and Family: Twice a week    Attends Religious Services: Never    Database administrator or Organizations: Yes    Attends Engineer, structural: 1 to 4 times per year    Marital Status: Married     Family History: The patient's family history includes Diabetes in his mother.  ROS:   Please see the history of present illness.     All other systems reviewed and are negative.  EKGs/Labs/Other Studies Reviewed:    The following studies were reviewed today:  EKG Interpretation Date/Time:  Monday October 28 2023 10:43:29 EDT Ventricular Rate:  57 PR Interval:  144 QRS Duration:  94 QT Interval:  430 QTC Calculation: 418 R Axis:   -41  Text Interpretation: Sinus bradycardia with frequent Premature ventricular complexes Left axis deviation Moderate voltage criteria for LVH, may be normal variant ( R in aVL , Cornell product ) Nonspecific ST and T wave abnormality Confirmed by Darliss Rogue ([id number]) on 10/28/2023 10:46:24 AM    Recent Labs: 10/02/2023: ALT 10 10/05/2023: BUN 18; Creatinine, Ser 0.87; Hemoglobin 12.5; Platelets 116; Potassium 3.7; Sodium 139  Recent Lipid Panel No results found for: CHOL, TRIG, HDL, CHOLHDL, VLDL, LDLCALC, LDLDIRECT   Risk Assessment/Calculations:            Physical Exam:    VS:  BP (!) 182/74 (BP Location: Left Arm, Patient Position: Sitting, Cuff Size: Normal)   Pulse (!) 53   Ht 5' 6 (1.676 m)   Wt 162 lb 12.8 oz (73.8 kg)   SpO2 100%   BMI 26.28 kg/m     Wt Readings from Last 3 Encounters:  10/28/23 162 lb 12.8 oz (73.8 kg)  10/18/23 162 lb  (73.5 kg)  10/03/23 163 lb 12.8 oz (74.3 kg)     GEN:  Well nourished, well developed in no acute distress HEENT: Normal NECK: No JVD; No carotid bruits CARDIAC: RRR, no murmurs, rubs, gallops RESPIRATORY:  Clear to auscultation without rales, wheezing or rhonchi  ABDOMEN: Soft, non-tender, non-distended MUSCULOSKELETAL:  No edema; No deformity  SKIN: Warm and dry NEUROLOGIC:  Alert and oriented x 3 PSYCHIATRIC:  Normal affect   ASSESSMENT:    1. Primary hypertension   2. Pre-procedural cardiovascular examination   3. ASCVD (arteriosclerotic cardiovascular disease)  PLAN:    In order of problems listed above:  Hypertension, BP elevated.  Patient not sure if he is taking Norvasc 2.5 mg at home as prescribed.  Will prescribe Norvasc 5 mg daily, follow-up in 2 to 3 weeks, titrate BP meds as appropriate for adequate control, goal systolic BP of less than 140.  Advised to check BP at home and keep a log. Preprocedural exam, lithotripsy deemed low risk from the cardiac perspective.  Patient has no cardiac symptoms of chest pain or shortness of breath.  Management of BP as above.  Okay for procedure from a cardiac perspective. History of ASCVD, AAA s/p endovascular repair.  Denies chest pain.  Obtain echo.  Obtaining echocardiogram should not delay procedure.  Follow-up in 2-3 weeks for BP check.         Medication Adjustments/Labs and Tests Ordered: Current medicines are reviewed at length with the patient today.  Concerns regarding medicines are outlined above.  Orders Placed This Encounter  Procedures   EKG 12-Lead   ECHOCARDIOGRAM COMPLETE   Meds ordered this encounter  Medications   amLODipine (NORVASC) 5 MG tablet    Sig: Take 0.5 tablets (2.5 mg total) by mouth daily.    Dispense:  30 tablet    Refill:  3    Patient Instructions  Medication Instructions:  - START norvasc 5 mg daily   *If you need a refill on your cardiac medications before your next appointment,  please call your pharmacy*  Lab Work: No labs ordered today  If you have labs (blood work) drawn today and your tests are completely normal, you will receive your results only by: MyChart Message (if you have MyChart) OR A paper copy in the mail If you have any lab test that is abnormal or we need to change your treatment, we will call you to review the results.  Testing/Procedures: Your physician has requested that you have an echocardiogram. Echocardiography is a painless test that uses sound waves to create images of your heart. It provides your doctor with information about the size and shape of your heart and how well your heart's chambers and valves are working.   You may receive an ultrasound enhancing agent through an IV if needed to better visualize your heart during the echo. This procedure takes approximately one hour.  There are no restrictions for this procedure.  This will take place at [address] [facility], [place] [address]  Please note: We ask at that you not bring children with you during ultrasound (echo/ vascular) testing. Due to room size and safety concerns, children are not allowed in the ultrasound rooms during exams. Our front office staff cannot provide observation of children in our lobby area while testing is being conducted. An adult accompanying a patient to their appointment will only be allowed in the ultrasound room at the discretion of the ultrasound technician under special circumstances. We apologize for any inconvenience.   Follow-Up: At [facility], you and your health needs are our priority.  As part of our continuing mission to provide you with exceptional heart care, our providers are all part of one team.  This team includes your primary Cardiologist (physician) and Advanced Practice Providers or APPs (Physician Assistants and Nurse Practitioners) who all work together to provide you with the care you need, when you  need it.  Your next appointment:   2 week(s)  Provider:   You may see Dr. Darliss or one of  the following Advanced Practice Providers on your designated Care Team:   Lonni Meager, NP Lesley Maffucci, PA-C Bernardino Bring, PA-C Cadence [place], PA-C Tylene Lunch, NP Barnie Hila, NP    We recommend signing up for the patient portal called MyChart.  Sign up information is provided on this After Visit Summary.  MyChart is used to connect with patients for Virtual Visits (Telemedicine).  Patients are able to view lab/test results, encounter notes, upcoming appointments, etc.  Non-urgent messages can be sent to your provider as well.   To learn more about what you can do with MyChart, go to [email or web address].          Signed, Redell Cave, MD  10/28/2023 11:31 AM    [facility]

## 2023-10-28 NOTE — Patient Instructions (Signed)
 Medication Instructions:  - START norvasc 5 mg daily   *If you need a refill on your cardiac medications before your next appointment, please call your pharmacy*  Lab Work: No labs ordered today  If you have labs (blood work) drawn today and your tests are completely normal, you will receive your results only by: MyChart Message (if you have MyChart) OR A paper copy in the mail If you have any lab test that is abnormal or we need to change your treatment, we will call you to review the results.  Testing/Procedures: Your physician has requested that you have an echocardiogram. Echocardiography is a painless test that uses sound waves to create images of your heart. It provides your doctor with information about the size and shape of your heart and how well your heart's chambers and valves are working.   You may receive an ultrasound enhancing agent through an IV if needed to better visualize your heart during the echo. This procedure takes approximately one hour.  There are no restrictions for this procedure.  This will take place at [address] [facility]) [address], [place] [address]  Please note: We ask at that you not bring children with you during ultrasound (echo/ vascular) testing. Due to room size and safety concerns, children are not allowed in the ultrasound rooms during exams. Our front office staff cannot provide observation of children in our lobby area while testing is being conducted. An adult accompanying a patient to their appointment will only be allowed in the ultrasound room at the discretion of the ultrasound technician under special circumstances. We apologize for any inconvenience.   Follow-Up: At [facility], you and your health needs are our priority.  As part of our continuing mission to provide you with exceptional heart care, our providers are all part of one team.  This team includes your primary Cardiologist (physician) and Advanced  Practice Providers or APPs (Physician Assistants and Nurse Practitioners) who all work together to provide you with the care you need, when you need it.  Your next appointment:   2 week(s)  Provider:   You may see Dr. Darliss or one of the following Advanced Practice Providers on your designated Care Team:   Lonni Meager, NP Lesley Maffucci, PA-C Bernardino Bring, PA-C Cadence Bentley, PA-C Tylene Lunch, NP Barnie Hila, NP    We recommend signing up for the patient portal called MyChart.  Sign up information is provided on this After Visit Summary.  MyChart is used to connect with patients for Virtual Visits (Telemedicine).  Patients are able to view lab/test results, encounter notes, upcoming appointments, etc.  Non-urgent messages can be sent to your provider as well.   To learn more about what you can do with MyChart, go to [email or web address].

## 2023-10-31 ENCOUNTER — Telehealth: Payer: Self-pay | Admitting: Cardiology

## 2023-10-31 NOTE — Telephone Encounter (Signed)
 Wife called to follow-up on prior authorization from patient's insurance company to have Echocardiogram test.

## 2023-11-13 ENCOUNTER — Ambulatory Visit: Attending: Nurse Practitioner | Admitting: Nurse Practitioner

## 2023-11-13 ENCOUNTER — Encounter: Payer: Self-pay | Admitting: Nurse Practitioner

## 2023-11-13 VITALS — BP 138/70 | HR 88 | Ht 66.0 in | Wt 161.1 lb

## 2023-11-13 DIAGNOSIS — E785 Hyperlipidemia, unspecified: Secondary | ICD-10-CM | POA: Diagnosis not present

## 2023-11-13 DIAGNOSIS — I10 Essential (primary) hypertension: Secondary | ICD-10-CM

## 2023-11-13 DIAGNOSIS — Z01810 Encounter for preprocedural cardiovascular examination: Secondary | ICD-10-CM

## 2023-11-13 DIAGNOSIS — I7143 Infrarenal abdominal aortic aneurysm, without rupture: Secondary | ICD-10-CM

## 2023-11-13 NOTE — Patient Instructions (Signed)
 Medication Instructions:  No changes *If you need a refill on your cardiac medications before your next appointment, please call your pharmacy*  Lab Work: None ordered If you have labs (blood work) drawn today and your tests are completely normal, you will receive your results only by: MyChart Message (if you have MyChart) OR A paper copy in the mail If you have any lab test that is abnormal or we need to change your treatment, we will call you to review the results.  Testing/Procedures: Keep your echo appointment   Follow-Up: At [facility], you and your health needs are our priority.  As part of our continuing mission to provide you with exceptional heart care, our providers are all part of one team.  This team includes your primary Cardiologist (physician) and Advanced Practice Providers or APPs (Physician Assistants and Nurse Practitioners) who all work together to provide you with the care you need, when you need it.  Your next appointment:   Follow up as needed based on your echo results

## 2023-11-13 NOTE — Assessment & Plan Note (Addendum)
 Bilateral nonobstructive renal stones Measuring up to 1.4-1.5 cm History of recent UTI  - Gained cardiac clearance, cleared for surgery-although has pending echocardiogram - Discussed stone burden and management options again today-he remains asymptomatic with no recurrent UTI.  Decided to slow play aggressive surgical approach.  Will reimage with [facility] in 4-6 months.  May need bilateral URS/LL in the future, although I think fine for expectant management at this time.

## 2023-11-13 NOTE — Progress Notes (Signed)
 Office Visit    Patient Name: Jason Reese Date of Encounter: 11/13/2023  Primary Care Provider:  Odell Reese, Jason GRADE, MD Primary Cardiologist:  Jason Cave, MD  Cardiology APP:  Jason Jason Ingle, NP   Chief Complaint    83 y.o. male with a h/o AAA s/p endovascular repair in 06/2017, HTN, HL, remote tobacco abuse, nephrolithiasis, glaucoma, and hearing loss, who presents for follow-up related to HTN.  Past Medical History   Subjective   Past Medical History:  Diagnosis Date   AAA (abdominal aortic aneurysm)    a. 06/2017 s/p 28x12x16 C3 Gore Excluder Endoprosthesis main body w/ a 12x12 contralateral limb.   Cataract    Glaucoma    Hard of hearing    History of kidney stones    Hyperlipidemia    PONV (postoperative nausea and vomiting)    Wears hearing aid in both ears    Past Surgical History:  Procedure Laterality Date   CATARACT EXTRACTION W/PHACO Right 05/16/2020   Procedure: CATARACT EXTRACTION PHACO AND INTRAOCULAR LENS PLACEMENT (IOC) RIGHT;  Surgeon: Jason Adine Anes, MD;  Location: [facility];  Service: Ophthalmology;  Laterality: Right;  9.39 0:58.0   CATARACT EXTRACTION W/PHACO Left 05/30/2020   Procedure: CATARACT EXTRACTION PHACO AND INTRAOCULAR LENS PLACEMENT (IOC) LEFT;  Surgeon: Jason Adine Anes, MD;  Location: [facility];  Service: Ophthalmology;  Laterality: Left;  9.04 0:58.7   ENDOVASCULAR STENT GRAFT (AAA) N/A 07/24/2017   Procedure: ENDOVASCULAR REPAIR/STENT GRAFT;  Surgeon: Jason Selinda RAMAN, MD;  Location: [facility];  Service: Cardiovascular;  Laterality: N/A;   EYE SURGERY  2013   INGUINAL HERNIA REPAIR Right 08/25/2014   Procedure: RIGHT INGUINAL HERNIA REPAIR WITH MESH ;  Surgeon: Jason Brands, MD;  Location: [facility];  Service: General;  Laterality: Right;   INGUINAL HERNIA REPAIR Left 1980   x 2- Dr. Brendan   INGUINAL HERNIA REPAIR Right    JOINT REPLACEMENT Left 2007   shoulder    SHOULDER ARTHROSCOPY Left     Allergies  No Known Allergies     History of Present Illness      83 y.o. y/o male with a h/o AAA s/p endovascular repair in 06/2017, HTN, HL, remote tobacco abuse, nephrolithiasis, glaucoma, and hearing loss.  He has no prior cardiac history.  As noted, in the setting of AAA, he underwent endovascular repair in 06/2017.  He has since been followed closely by vascular surgery.  He was admitted to [facility] in 09/2023 with UTI and sepsis.  Imaging revealed multiple bilateral renal calculi, and he followed up with urology as an outpatient with plan for lithotripsy.  He established care with Dr. Cave in late 09/2023 for preoperative cardiac assessment prior to lithotripsy.  He was noted to hypertensive at the time and he was prescribed amlodipine 5 mg daily.   Since his last visit, [name] has noticed that his blood pressure has been gradually coming down.  His wife says that his systolic was 110 yesterday and 120 the day before.  [name] denies chest pain, dyspnea, palpitations, PND, orthopnea, dizziness, syncope, edema, or early satiety.  Thus far, he is tolerating amlodipine well.  He has follow-up with urology within the next week. Objective   Home Medications    Current Outpatient Medications  Medication Sig Dispense Refill   acetaminophen  (TYLENOL ) 500 MG tablet Take 500 mg by mouth every 6 (six) hours as needed.      amLODipine (NORVASC)  5 MG tablet Take 0.5 tablets (2.5 mg total) by mouth daily. 30 tablet 3   aspirin  EC 81 MG tablet Take 81 mg by mouth.     dorzolamide -timolol  (COSOPT ) 22.3-6.8 MG/ML ophthalmic solution Place 1 drop into both eyes 2 (two) times daily.     latanoprost  (XALATAN ) 0.005 % ophthalmic solution Place 1 drop into both eyes at bedtime.  3   lovastatin (MEVACOR) 40 MG tablet Take 40 mg by mouth at bedtime.     tamsulosin  (FLOMAX ) 0.4 MG CAPS capsule Take 1 capsule (0.4 mg total) by mouth daily after supper. 90 capsule 3   No  current facility-administered medications for this visit.     Physical Exam    VS:  BP 138/70 (BP Location: Left Arm, Patient Position: Sitting, Cuff Size: Normal)   Pulse 88   Ht 5' 6 (1.676 m)   Wt 161 lb 2 oz (73.1 kg)   SpO2 98%   BMI 26.01 kg/m  , BMI Body mass index is 26.01 kg/m.          GEN: Well nourished, well developed, in no acute distress. HEENT: normal. Neck: Supple, no JVD, carotid bruits, or masses. Cardiac: RRR, no murmurs, rubs, or gallops. No clubbing, cyanosis, edema.  Radials 2+ and equal bilaterally.  Respiratory:  Respirations regular and unlabored, clear to auscultation bilaterally. GI: Soft, nontender, nondistended, BS + x 4. MS: no deformity or atrophy. Skin: warm and dry, no rash. Neuro:  Strength and sensation are intact. Psych: Normal affect.  Accessory Clinical Findings     Lab Results  Component Value Date   WBC 7.1 10/05/2023   HGB 12.5 (L) 10/05/2023   HCT 36.9 (L) 10/05/2023   MCV 88.1 10/05/2023   PLT 116 (L) 10/05/2023   Lab Results  Component Value Date   CREATININE 0.87 10/05/2023   BUN 18 10/05/2023   NA 139 10/05/2023   K 3.7 10/05/2023   CL 107 10/05/2023   CO2 24 10/05/2023   Lab Results  Component Value Date   ALT 10 10/02/2023   AST 23 10/02/2023   ALKPHOS 61 10/02/2023   BILITOT 0.7 10/02/2023       Assessment & Plan    1.  Primary hypertension: Patient was hypertensive on September office visit and was placed amlodipine 5 mg daily at that time.  Since then, he notes improvement in home blood pressures, now trending in the 110-120 range.  He is 138/70 today.  He has tolerated amlodipine well.  As such, we will continue current dose of amlodipine.  2.  Preoperative cardiovascular examination/nephrolithiasis: Patient is pending bilateral ureteroscopy laser lithotripsy in the setting of bilateral stones and recent history of cystitis and sepsis.  Patient denies any history of chest pain or dyspnea and is capable  of achieving at least 5.5 METS.  He is pending an echocardiogram however, as previously noted, he is low risk for planned procedure and will not require additional ischemic evaluation.  We do not need to wait for the echocardiogram results prior to proceeding.  3.  Hyperlipidemia: Patient on lovastatin therapy with an LDL of 82 earlier this year.  LFTs were normal in September.  4.  Abdominal aortic aneurysm: Status post endovascular pair in June 2019.  Followed closely by vascular surgery.  Blood pressure improved as outlined above.    5.  Disposition: Follow-up echo as planned.  Provided that this is normal, patient will follow-up as needed.  Jason Meager, NP 11/13/2023, 9:11 AM

## 2023-11-13 NOTE — Progress Notes (Signed)
   11/22/2023 8:44 AM   Vinie [name] Moats 03/02/1940 [id number]  Reason for visit: Follow up nephrolithiasis   HPI: 83 y.o. male, follow up with me today Last seen in Sept 2025 - reviewed bilateral stone burden, awaiting cardiac clearance before URS/LL   - saw Cardiology 10/15 - gave surgical clearance, has pending Echo although should not postpone surgery  Prior HPI: 83 year old male here for initial evaluation of  Admitted 10/02/23 - N/V, weakness with ground level fall, while urinating    - Found to have 100k Aerococcus UTI, s/p rocephin  --> Macrobid  x7d course Several month history of progressive LUTS, emptying    CT A/P w/ con (10/03/23) -  KIDNEYS, URETERS AND BLADDER: Multiple bilateral nonobstructing renal calculi measuring up to 14 mm. No hydronephrosis. No perinephric or periureteral stranding. Urinary bladder is unremarkable.   Today patient is accompanied by his wife He is feeling much better, completed antibiotics, continues on Flomax  Reported history of nephrolithiasis, prior ESWL and several past stones   Hx of AAA s/p repair, HLD, glucoma History of recent bradycardia (asymptomatic by my interview today) -but referred to cardiology for ongoing workup   - Denies angina at rest, denies shortness of breath, reports>    Physical Exam: BP (!) 147/74   Pulse 67   Ht 5' 6 (1.676 m)   Wt 162 lb (73.5 kg)   BMI 26.15 kg/m    Constitutional:  Alert and oriented, No acute distress.  Laboratory Data: N/A  Pertinent Imaging: N/A    Assessment & Plan:    Nephrolithiasis Assessment & Plan: Bilateral nonobstructive renal stones Measuring up to 1.4-1.5 cm History of recent UTI  - Gained cardiac clearance, cleared for surgery-although has pending echocardiogram - Discussed stone burden and management options again today-he remains asymptomatic with no recurrent UTI.  Decided to slow play aggressive surgical approach.  Will reimage with [facility] in 4-6 months.   May need bilateral URS/LL in the future, although I think fine for expectant management at this time.   Orders: -     DG Abd 1 View; Future  BPH with obstruction/lower urinary tract symptoms Assessment & Plan: Progressive symptoms  PVR 73cc today Recent UTI  - Continue recently prescribed Flomax , will refill as long-term medication -Close expectant management for now, if significant worsening or recurrent UTI-Will proceed with more workup, surgery for stones   Acute cystitis without hematuria Assessment & Plan: Recent UTI w/ sepsis, requiring admission  Progressive LUTS and voiding dysfunction   Unclear etiology, first UTI.  Will continue to follow closely.  Emptying well  -Continue Flomax , emphasized bladder hygiene, avoidance of constipation, timed and double voiding - Lower likelihood that renal stone burden is causative as an infectious nidus-although possible.  -Close observation for now, if he incurs additional UTIs will proceed with further BPH and stone management        Penne JONELLE Skye, MD  [facility] [address], [address] [place], [place] [address] [phone]

## 2023-11-22 ENCOUNTER — Ambulatory Visit: Admitting: Urology

## 2023-11-22 VITALS — BP 147/74 | HR 67 | Ht 66.0 in | Wt 162.0 lb

## 2023-11-22 DIAGNOSIS — N138 Other obstructive and reflux uropathy: Secondary | ICD-10-CM

## 2023-11-22 DIAGNOSIS — N200 Calculus of kidney: Secondary | ICD-10-CM | POA: Diagnosis not present

## 2023-11-22 DIAGNOSIS — N401 Enlarged prostate with lower urinary tract symptoms: Secondary | ICD-10-CM

## 2023-11-22 DIAGNOSIS — N3000 Acute cystitis without hematuria: Secondary | ICD-10-CM | POA: Diagnosis not present

## 2023-11-22 NOTE — Assessment & Plan Note (Addendum)
 Progressive symptoms  PVR 73cc today Recent UTI  - Continue recently prescribed Flomax , will refill as long-term medication -Close expectant management for now, if significant worsening or recurrent UTI-Will proceed with more workup, surgery for stones

## 2023-11-22 NOTE — Assessment & Plan Note (Signed)
 Recent UTI w/ sepsis, requiring admission  Progressive LUTS and voiding dysfunction   Unclear etiology, first UTI.  Will continue to follow closely.  Emptying well  -Continue Flomax , emphasized bladder hygiene, avoidance of constipation, timed and double voiding - Lower likelihood that renal stone burden is causative as an infectious nidus-although possible.  -Close observation for now, if he incurs additional UTIs will proceed with further BPH and stone management

## 2023-12-18 ENCOUNTER — Ambulatory Visit: Attending: Cardiology

## 2023-12-18 ENCOUNTER — Other Ambulatory Visit: Payer: Self-pay | Admitting: Cardiology

## 2023-12-18 DIAGNOSIS — I10 Essential (primary) hypertension: Secondary | ICD-10-CM

## 2023-12-18 DIAGNOSIS — Z01810 Encounter for preprocedural cardiovascular examination: Secondary | ICD-10-CM

## 2023-12-18 DIAGNOSIS — I2510 Atherosclerotic heart disease of native coronary artery without angina pectoris: Secondary | ICD-10-CM | POA: Diagnosis not present

## 2023-12-18 LAB — ECHOCARDIOGRAM COMPLETE
AR max vel: 2.3 cm2
AV Area VTI: 2.33 cm2
AV Area mean vel: 2.34 cm2
AV Mean grad: 6 mmHg
AV Peak grad: 10.9 mmHg
Ao pk vel: 1.65 m/s
S' Lateral: 4.13 cm

## 2023-12-23 ENCOUNTER — Ambulatory Visit: Payer: Self-pay | Admitting: Cardiology

## 2023-12-23 DIAGNOSIS — I5020 Unspecified systolic (congestive) heart failure: Secondary | ICD-10-CM

## 2024-01-03 ENCOUNTER — Encounter: Payer: Self-pay | Admitting: Cardiology

## 2024-01-03 ENCOUNTER — Ambulatory Visit: Attending: Cardiology | Admitting: Cardiology

## 2024-01-03 VITALS — BP 124/60 | HR 56 | Ht 66.0 in | Wt 164.6 lb

## 2024-01-03 DIAGNOSIS — R072 Precordial pain: Secondary | ICD-10-CM

## 2024-01-03 DIAGNOSIS — I10 Essential (primary) hypertension: Secondary | ICD-10-CM

## 2024-01-03 DIAGNOSIS — I5020 Unspecified systolic (congestive) heart failure: Secondary | ICD-10-CM

## 2024-01-03 DIAGNOSIS — I7143 Infrarenal abdominal aortic aneurysm, without rupture: Secondary | ICD-10-CM | POA: Diagnosis not present

## 2024-01-03 MED ORDER — LOSARTAN POTASSIUM 25 MG PO TABS: 25.0000 mg | ORAL_TABLET | Freq: Every day | ORAL | 3 refills | Status: AC

## 2024-01-03 MED ORDER — METOPROLOL TARTRATE 25 MG PO TABS: 25.0000 mg | ORAL_TABLET | Freq: Once | ORAL | 0 refills | Status: DC

## 2024-01-03 MED ORDER — METOPROLOL SUCCINATE ER 25 MG PO TB24: 25.0000 mg | ORAL_TABLET | Freq: Every day | ORAL | 3 refills | Status: AC

## 2024-01-03 NOTE — Progress Notes (Signed)
 Cardiology Office Note:    Date:  01/03/2024   ID:  Jason Reese, DOB [birth year], MRN [id number]  PCP:  Odell Chard, Edra GRADE, MD   [facility] Providers Cardiologist:  Redell Cave, MD Cardiology APP:  Vivienne Lonni Ingle, NP     Referring MD: Odell Chard, [facility] *   Chief Complaint  Patient presents with   Follow-up    CARDIAC CATH CONSIDERATION:  pt has been doing well with no complaints of chest pain, chest pressure or SOB, medciation reviewed verbally with patient    History of Present Illness:    Jason Reese is a 83 y.o. 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  Previously seen for hypertension, started on amlodipine  with good effect.  Denies chest pain or shortness of breath.  BP better controlled at home since starting amlodipine .  Denies edema  Echocardiogram obtained 11/2023 EF 30 to 35%.   Past Medical History:  Diagnosis Date   AAA (abdominal aortic aneurysm)    a. 06/2017 s/p 28x12x16 C3 Gore Excluder Endoprosthesis main body w/ a 12x12 contralateral limb.   Cataract    Glaucoma    Hard of hearing    History of kidney stones    Hyperlipidemia    PONV (postoperative nausea and vomiting)    Wears hearing aid in both ears     Past Surgical History:  Procedure Laterality Date   CATARACT EXTRACTION W/PHACO Right 05/16/2020   Procedure: CATARACT EXTRACTION PHACO AND INTRAOCULAR LENS PLACEMENT (IOC) RIGHT;  Surgeon: Myrna Adine Anes, MD;  Location: [facility];  Service: Ophthalmology;  Laterality: Right;  9.39 0:58.0   CATARACT EXTRACTION W/PHACO Left 05/30/2020   Procedure: CATARACT EXTRACTION PHACO AND INTRAOCULAR LENS PLACEMENT (IOC) LEFT;  Surgeon: Myrna Adine Anes, MD;  Location: [facility];  Service: Ophthalmology;  Laterality: Left;  9.04 0:58.7   ENDOVASCULAR STENT  GRAFT (AAA) N/A 07/24/2017   Procedure: ENDOVASCULAR REPAIR/STENT GRAFT;  Surgeon: Marea Selinda RAMAN, MD;  Location: [facility];  Service: Cardiovascular;  Laterality: N/A;   EYE SURGERY  2013   INGUINAL HERNIA REPAIR Right 08/25/2014   Procedure: RIGHT INGUINAL HERNIA REPAIR WITH MESH ;  Surgeon: Lonni Brands, MD;  Location: [facility];  Service: General;  Laterality: Right;   INGUINAL HERNIA REPAIR Left 1980   x 2- Dr. Brendan   INGUINAL HERNIA REPAIR Right    JOINT REPLACEMENT Left 2007   shoulder   SHOULDER ARTHROSCOPY Left     Current Medications: Current Meds  Medication Sig   acetaminophen  (TYLENOL ) 500 MG tablet Take 500 mg by mouth every 6 (six) hours as needed.    aspirin  EC 81 MG tablet Take 81 mg by mouth.   dorzolamide -timolol  (COSOPT ) 22.3-6.8 MG/ML ophthalmic solution Place 1 drop into both eyes 2 (two) times daily.   latanoprost  (XALATAN ) 0.005 % ophthalmic solution Place 1 drop into both eyes at bedtime.   losartan  (COZAAR ) 25 MG tablet Take 1 tablet (25 mg total) by mouth daily.   lovastatin (MEVACOR) 40 MG tablet Take 40 mg by mouth at bedtime.   metoprolol  succinate (TOPROL  XL) 25 MG 24 hr tablet Take 1 tablet (25 mg total) by mouth daily.   metoprolol  tartrate (LOPRESSOR ) 25 MG tablet Take 1 tablet (25 mg total) by mouth once for 1 dose. Two hours before cardiac CT   tamsulosin  (FLOMAX ) 0.4  MG CAPS capsule Take 1 capsule (0.4 mg total) by mouth daily after supper.   [DISCONTINUED] amLODipine  (NORVASC ) 5 MG tablet Take 0.5 tablets (2.5 mg total) by mouth daily.     Allergies:   Patient has no known allergies.   Social History   Socioeconomic History   Marital status: Married    Spouse name: Not on file   Number of children: Not on file   Years of education: Not on file   Highest education level: Not on file  Occupational History   Not on file  Tobacco Use   Smoking status: Former    Current packs/day: 0.00    Types: Cigarettes    Quit date:  07/28/2005    Years since quitting: 18.4   Smokeless tobacco: Never  Vaping Use   Vaping status: Never Used  Substance and Sexual Activity   Alcohol use: No   Drug use: No   Sexual activity: Not on file  Other Topics Concern   Not on file  Social History Narrative   Not on file   Social Drivers of Health   Financial Resource Strain: Not on file  Food Insecurity: No Food Insecurity (10/03/2023)   Hunger Vital Sign    Worried About Running Out of Food in the Last Year: Never true    Ran Out of Food in the Last Year: Never true  Transportation Needs: No Transportation Needs (10/03/2023)   PRAPARE - Administrator, Civil Service (Medical): No    Lack of Transportation (Non-Medical): No  Physical Activity: Not on file  Stress: Not on file  Social Connections: Moderately Integrated (10/03/2023)   Social Connection and Isolation Panel    Frequency of Communication with Friends and Family: Three times a week    Frequency of Social Gatherings with Friends and Family: Twice a week    Attends Religious Services: Never    Database Administrator or Organizations: Yes    Attends Engineer, Structural: 1 to 4 times per year    Marital Status: Married     Family History: The patient's family history includes Diabetes in his mother.  ROS:   Please see the history of present illness.     All other systems reviewed and are negative.  EKGs/Labs/Other Studies Reviewed:    The following studies were reviewed today:       Recent Labs: 10/02/2023: ALT 10 10/05/2023: BUN 18; Creatinine, Ser 0.87; Hemoglobin 12.5; Platelets 116; Potassium 3.7; Sodium 139  Recent Lipid Panel No results found for: CHOL, TRIG, HDL, CHOLHDL, VLDL, LDLCALC, LDLDIRECT   Risk Assessment/Calculations:            Physical Exam:    VS:  BP 124/60 (BP Location: Left Arm, Patient Position: Sitting, Cuff Size: Normal)   Pulse (!) 56   Ht 5' 6 (1.676 m)   Wt 164 lb 9.6 oz (74.7 kg)    SpO2 96%   BMI 26.57 kg/m     Wt Readings from Last 3 Encounters:  01/03/24 164 lb 9.6 oz (74.7 kg)  11/22/23 162 lb (73.5 kg)  11/13/23 161 lb 2 oz (73.1 kg)     GEN:  Well nourished, well developed in no acute distress HEENT: Normal NECK: No JVD; No carotid bruits CARDIAC: RRR, no murmurs, rubs, gallops RESPIRATORY:  Clear to auscultation without rales, wheezing or rhonchi  ABDOMEN: Soft, non-tender, non-distended MUSCULOSKELETAL:  No edema; No deformity  SKIN: Warm and dry NEUROLOGIC:  Alert and oriented  x 3 PSYCHIATRIC:  Normal affect   ASSESSMENT:    1. HFrEF (heart failure with reduced ejection fraction) (HCC)   2. Primary hypertension   3. Infrarenal abdominal aortic aneurysm (AAA) without rupture   4. Precordial pain     PLAN:    In order of problems listed above:  Cardiomyopathy, echo 11/25 EF 30 to 35%.  Wanted to hold off on left heart cath at this time.  Obtain coronary CT.  Start Toprol -XL 25 mg daily, losartan  25 mg daily.  Previously referred to advanced heart failure clinic. Hypertension, BP controlled.  Stop Norvasc , start Toprol -XL and losartan  as above. AAA s/p endovascular repair.  Follow-up with vascular surgery.  Follow-up in 6 to 8 weeks for medication titration.       Medication Adjustments/Labs and Tests Ordered: Current medicines are reviewed at length with the patient today.  Concerns regarding medicines are outlined above.  Orders Placed This Encounter  Procedures   CT CORONARY MORPH W/CTA COR W/SCORE W/CA W/CM &/OR WO/CM   Basic metabolic panel with GFR   Meds ordered this encounter  Medications   metoprolol  succinate (TOPROL  XL) 25 MG 24 hr tablet    Sig: Take 1 tablet (25 mg total) by mouth daily.    Dispense:  30 tablet    Refill:  3   losartan  (COZAAR ) 25 MG tablet    Sig: Take 1 tablet (25 mg total) by mouth daily.    Dispense:  90 tablet    Refill:  3   metoprolol  tartrate (LOPRESSOR ) 25 MG tablet    Sig: Take 1 tablet  (25 mg total) by mouth once for 1 dose. Two hours before cardiac CT    Dispense:  1 tablet    Refill:  0    Patient Instructions  Medication Instructions:  - STOP amlodipine  - START toprol  xl 25 mg daily - START losartan  25 mg daily   *If you need a refill on your cardiac medications before your next appointment, please call your pharmacy*  Lab Work: Your provider would like for you to have following labs drawn today BMP.   If you have labs (blood work) drawn today and your tests are completely normal, you will receive your results only by: MyChart Message (if you have MyChart) OR A paper copy in the mail If you have any lab test that is abnormal or we need to change your treatment, we will call you to review the results.  Testing/Procedures:   Your cardiac CT will be scheduled at one of the below locations:    [facility] [address] [place], [place] [address] [phone]  OR   [facility] [address]  [place], [place] [address]  If scheduled at the [facility] at [address], please enter the parking lot using the [address] entrance and use the FREE valet service at the patient drop-off area. Enter the building and check-in with registration on the main floor.  If scheduled at [facility], please arrive to the Heart and Vascular Center 15 mins early for check-in and test prep.  There is spacious parking and easy access to the radiology department from the [facility] entrance. Please enter here and check-in with the desk attendant.   Please follow these instructions carefully (unless otherwise directed):  An IV will be required for this test and Nitroglycerin  will be given.  Hold all erectile dysfunction medications at  least 3 days (72 hrs) prior to test. (Ie viagra, cialis, sildenafil, tadalafil, etc)   On the Night Before the Test: Be sure to Drink  plenty of water. Do not consume any caffeinated/decaffeinated beverages or chocolate 12 hours prior to your test. Do not take any antihistamines 12 hours prior to your test.  If the patient has contrast allergy: Patient will need a prescription for Prednisone and very clear instructions (as follows): Prednisone 50 mg - take 13 hours prior to test Take another Prednisone 50 mg 7 hours prior to test Take another Prednisone 50 mg 1 hour prior to test Take Benadryl 50 mg 1 hour prior to test Patient must complete all four doses of above prophylactic medications. Patient will need a ride after test due to Benadryl.  On the Day of the Test: Drink plenty of water until 1 hour prior to the test. Do not eat any food 1 hour prior to test. You may take your regular medications prior to the test.  Take metoprolol  (Lopressor ) two hours prior to test.      After the Test: Drink plenty of water. After receiving IV contrast, you may experience a mild flushed feeling. This is normal. On occasion, you may experience a mild rash up to 24 hours after the test. This is not dangerous. If this occurs, you can take Benadryl 25 mg, Zyrtec, Claritin, or Allegra and increase your fluid intake. (Patients taking Tikosyn should avoid Benadryl, and may take Zyrtec, Claritin, or Allegra) If you experience trouble breathing, this can be serious. If it is severe call 911 IMMEDIATELY. If it is mild, please call our office.  We will call to schedule your test 2-4 weeks out understanding that some insurance companies will need an authorization prior to the service being performed.   For more information and frequently asked questions, please visit our website : [email or web address]  For non-scheduling related questions, please contact the cardiac imaging nurse navigator should you have any questions/concerns: Cardiac Imaging Nurse Navigators Direct Office Dial: [phone]   For scheduling needs, including  cancellations and rescheduling, please call Brittany, [phone].   Follow-Up: At [facility], you and your health needs are our priority.  As part of our continuing mission to provide you with exceptional heart care, our providers are all part of one team.  This team includes your primary Cardiologist (physician) and Advanced Practice Providers or APPs (Physician Assistants and Nurse Practitioners) who all work together to provide you with the care you need, when you need it.  Your next appointment:   2 month(s)  Provider:   You may see Redell Cave, MD or one of the following Advanced Practice Providers on your designated Care Team:   Lonni Meager, NP Lesley Maffucci, PA-C Bernardino Bring, PA-C Cadence [facility], PA-C Tylene Lunch, NP Barnie Hila, NP    We recommend signing up for the patient portal called MyChart.  Sign up information is provided on this After Visit Summary.  MyChart is used to connect with patients for Virtual Visits (Telemedicine).  Patients are able to view lab/test results, encounter notes, upcoming appointments, etc.  Non-urgent messages can be sent to your provider as well.   To learn more about what you can do with MyChart, go to [email or web address].             Signed, Redell Cave, MD  01/03/2024 12:10 PM    [facility]

## 2024-01-03 NOTE — Patient Instructions (Signed)
 Medication Instructions:  - STOP amlodipine  - START toprol  xl 25 mg daily - START losartan  25 mg daily   *If you need a refill on your cardiac medications before your next appointment, please call your pharmacy*  Lab Work: Your provider would like for you to have following labs drawn today BMP.   If you have labs (blood work) drawn today and your tests are completely normal, you will receive your results only by: MyChart Message (if you have MyChart) OR A paper copy in the mail If you have any lab test that is abnormal or we need to change your treatment, we will call you to review the results.  Testing/Procedures:   Your cardiac CT will be scheduled at one of the below locations:    [facility] [address] [place], [place] [address] [phone]  OR   [facility] [address]  [place], [place] [address]  If scheduled at the [facility] at [address], please enter the parking lot using the [address] entrance and use the FREE valet service at the patient drop-off area. Enter the building and check-in with registration on the main floor.  If scheduled at [facility], please arrive to the Heart and Vascular Center 15 mins early for check-in and test prep.  There is spacious parking and easy access to the radiology department from the [facility] entrance. Please enter here and check-in with the desk attendant.   Please follow these instructions carefully (unless otherwise directed):  An IV will be required for this test and Nitroglycerin  will be given.  Hold all erectile dysfunction medications at least 3 days (72 hrs) prior to test. (Ie viagra, cialis, sildenafil, tadalafil, etc)   On the Night Before the Test: Be sure to Drink plenty of water. Do not consume any caffeinated/decaffeinated beverages or chocolate 12 hours prior to your test. Do not take any  antihistamines 12 hours prior to your test.  If the patient has contrast allergy: Patient will need a prescription for Prednisone and very clear instructions (as follows): Prednisone 50 mg - take 13 hours prior to test Take another Prednisone 50 mg 7 hours prior to test Take another Prednisone 50 mg 1 hour prior to test Take Benadryl 50 mg 1 hour prior to test Patient must complete all four doses of above prophylactic medications. Patient will need a ride after test due to Benadryl.  On the Day of the Test: Drink plenty of water until 1 hour prior to the test. Do not eat any food 1 hour prior to test. You may take your regular medications prior to the test.  Take metoprolol  (Lopressor ) two hours prior to test.      After the Test: Drink plenty of water. After receiving IV contrast, you may experience a mild flushed feeling. This is normal. On occasion, you may experience a mild rash up to 24 hours after the test. This is not dangerous. If this occurs, you can take Benadryl 25 mg, Zyrtec, Claritin, or Allegra and increase your fluid intake. (Patients taking Tikosyn should avoid Benadryl, and may take Zyrtec, Claritin, or Allegra) If you experience trouble breathing, this can be serious. If it is severe call 911 IMMEDIATELY. If it is mild, please call our office.  We will call to schedule your test 2-4 weeks out understanding that some insurance companies will need an authorization prior to the service being performed.  For more information and frequently asked questions, please visit our website : [email or web address]  For non-scheduling related questions, please contact the cardiac imaging nurse navigator should you have any questions/concerns: Cardiac Imaging Nurse Navigators Direct Office Dial: [phone]   For scheduling needs, including cancellations and rescheduling, please call Brittany, [phone].   Follow-Up: At [facility], you and your health  needs are our priority.  As part of our continuing mission to provide you with exceptional heart care, our providers are all part of one team.  This team includes your primary Cardiologist (physician) and Advanced Practice Providers or APPs (Physician Assistants and Nurse Practitioners) who all work together to provide you with the care you need, when you need it.  Your next appointment:   2 month(s)  Provider:   You may see Redell Cave, MD or one of the following Advanced Practice Providers on your designated Care Team:   Lonni Meager, NP Lesley Maffucci, PA-C Bernardino Bring, PA-C Cadence Zarephath, PA-C Tylene Lunch, NP Barnie Hila, NP    We recommend signing up for the patient portal called MyChart.  Sign up information is provided on this After Visit Summary.  MyChart is used to connect with patients for Virtual Visits (Telemedicine).  Patients are able to view lab/test results, encounter notes, upcoming appointments, etc.  Non-urgent messages can be sent to your provider as well.   To learn more about what you can do with MyChart, go to [email or web address].

## 2024-01-04 LAB — BASIC METABOLIC PANEL WITH GFR
BUN/Creatinine Ratio: 14 (ref 10–24)
BUN: 13 mg/dL (ref 8–27)
CO2: 25 mmol/L (ref 20–29)
Calcium: 9.9 mg/dL (ref 8.6–10.2)
Chloride: 106 mmol/L (ref 96–106)
Creatinine, Ser: 0.93 mg/dL (ref 0.76–1.27)
Glucose: 91 mg/dL (ref 70–99)
Potassium: 4.8 mmol/L (ref 3.5–5.2)
Sodium: 141 mmol/L (ref 134–144)
eGFR: 82 mL/min/1.73 (ref >59)

## 2024-01-14 ENCOUNTER — Other Ambulatory Visit (INDEPENDENT_AMBULATORY_CARE_PROVIDER_SITE_OTHER): Payer: Self-pay | Admitting: Vascular Surgery

## 2024-01-14 DIAGNOSIS — I7143 Infrarenal abdominal aortic aneurysm, without rupture: Secondary | ICD-10-CM

## 2024-01-16 ENCOUNTER — Ambulatory Visit (INDEPENDENT_AMBULATORY_CARE_PROVIDER_SITE_OTHER): Payer: Medicare HMO | Admitting: Vascular Surgery

## 2024-01-16 ENCOUNTER — Encounter (INDEPENDENT_AMBULATORY_CARE_PROVIDER_SITE_OTHER): Payer: Self-pay | Admitting: Vascular Surgery

## 2024-01-16 ENCOUNTER — Other Ambulatory Visit (INDEPENDENT_AMBULATORY_CARE_PROVIDER_SITE_OTHER): Payer: Medicare HMO

## 2024-01-16 VITALS — BP 148/78 | HR 66 | Resp 18 | Wt 163.8 lb

## 2024-01-16 DIAGNOSIS — I739 Peripheral vascular disease, unspecified: Secondary | ICD-10-CM | POA: Diagnosis not present

## 2024-01-16 DIAGNOSIS — J439 Emphysema, unspecified: Secondary | ICD-10-CM

## 2024-01-16 DIAGNOSIS — I7143 Infrarenal abdominal aortic aneurysm, without rupture: Secondary | ICD-10-CM

## 2024-01-16 DIAGNOSIS — E785 Hyperlipidemia, unspecified: Secondary | ICD-10-CM

## 2024-01-16 NOTE — Progress Notes (Signed)
 MRN : [id number]  Jason Reese is a 83 y.o. (03-03-1940) male who presents with chief complaint of check circulation.  History of Present Illness:  The patient returns to the office for surveillance of an abdominal aortic aneurysm status post stent graft placement on 07/24/2017.   Procedure: Placement of a 28 x 12 x 16 C3 Gore Excluder Endoprosthesis main body with a 12 x 12 contralateral limb   Patient denies abdominal pain or back pain, no other abdominal complaints. No groin related complaints. No symptoms consistent with distal embolization No changes in claudication distance.   There have been no interval changes in his overall healthcare since his last visit.   Patient denies amaurosis fugax or TIA symptoms. There is no history of claudication or rest pain symptoms of the lower extremities. The patient denies angina or shortness of breath.   Duplex US  of the aorta and iliac arteries shows a 3.94 cm sac (previously 4.31 cm AAA sac) with no endoleak, decrease in the sac diameter compared to the previous study.   Previous ABI's Rt=1.18 and Lt=1.23 (triphasic PT artery signals bilaterally.  Previous ABI's Rt=1.15 and Lt=1.25 (triphasic PT artery signals bilaterally   Active Medications[1]  Past Medical History:  Diagnosis Date   AAA (abdominal aortic aneurysm)    a. 06/2017 s/p 28x12x16 C3 Gore Excluder Endoprosthesis main body w/ a 12x12 contralateral limb.   Cataract    Glaucoma    Hard of hearing    History of kidney stones    Hyperlipidemia    PONV (postoperative nausea and vomiting)    Wears hearing aid in both ears     Past Surgical History:  Procedure Laterality Date   CATARACT EXTRACTION W/PHACO Right 05/16/2020   Procedure: CATARACT EXTRACTION PHACO AND INTRAOCULAR LENS PLACEMENT (IOC) RIGHT;  Surgeon: Myrna Adine Anes, MD;  Location: [facility];  Service: Ophthalmology;  Laterality:  Right;  9.39 0:58.0   CATARACT EXTRACTION W/PHACO Left 05/30/2020   Procedure: CATARACT EXTRACTION PHACO AND INTRAOCULAR LENS PLACEMENT (IOC) LEFT;  Surgeon: Myrna Adine Anes, MD;  Location: [facility];  Service: Ophthalmology;  Laterality: Left;  9.04 0:58.7   ENDOVASCULAR STENT GRAFT (AAA) N/A 07/24/2017   Procedure: ENDOVASCULAR REPAIR/STENT GRAFT;  Surgeon: Marea Selinda RAMAN, MD;  Location: [facility];  Service: Cardiovascular;  Laterality: N/A;   EYE SURGERY  2013   INGUINAL HERNIA REPAIR Right 08/25/2014   Procedure: RIGHT INGUINAL HERNIA REPAIR WITH MESH ;  Surgeon: Lonni Brands, MD;  Location: [facility];  Service: General;  Laterality: Right;   INGUINAL HERNIA REPAIR Left 1980   x 2- Dr. Brendan   INGUINAL HERNIA REPAIR Right    JOINT REPLACEMENT Left 2007   shoulder   SHOULDER ARTHROSCOPY Left     Social History Social History[2]  Family History Family History  Problem Relation Age of Onset   Diabetes Mother     Allergies[3]   REVIEW OF SYSTEMS (Negative unless checked)  Constitutional: [] Weight loss  [] Fever  [] Chills Cardiac: [] Chest pain   [] Chest pressure   [] Palpitations   [] Shortness of breath  when laying flat   [] Shortness of breath with exertion. Vascular:  [x] Pain in legs with walking   [] Pain in legs at rest  [] History of DVT   [] Phlebitis   [] Swelling in legs   [] Varicose veins   [] Non-healing ulcers Pulmonary:   [] Uses home oxygen   [] Productive cough   [] Hemoptysis   [] Wheeze  [] COPD   [] Asthma Neurologic:  [] Dizziness   [] Seizures   [] History of stroke   [] History of TIA  [] Aphasia   [] Vissual changes   [] Weakness or numbness in arm   [] Weakness or numbness in leg Musculoskeletal:   [] Joint swelling   [] Joint pain   [] Low back pain Hematologic:  [] Easy bruising  [] Easy bleeding   [] Hypercoagulable state   [] Anemic Gastrointestinal:  [] Diarrhea   [] Vomiting  [] Gastroesophageal reflux/heartburn   [] Difficulty swallowing. Genitourinary:   [] Chronic kidney disease   [] Difficult urination  [] Frequent urination   [] Blood in urine Skin:  [] Rashes   [] Ulcers  Psychological:  [] History of anxiety   []  History of major depression.  Physical Examination  Vitals:   01/16/24 0839  BP: (!) 148/78  Pulse: 66  Resp: 18  Weight: 163 lb 12.8 oz (74.3 kg)   Body mass index is 26.44 kg/m. Gen: WD/WN, NAD Head: [place]/AT, No temporalis wasting.  Ear/Nose/Throat: Hearing grossly intact, nares w/o erythema or drainage Eyes: PER, EOMI, sclera nonicteric.  Neck: Supple, no masses.  No bruit or JVD.  Pulmonary:  Good air movement, no audible wheezing, no use of accessory muscles.  Cardiac: RRR, normal S1, S2, no Murmurs. Vascular:  mild trophic changes, no open wounds Vessel Right Left  Radial Palpable Palpable  PT Palpable Palpable  DP Not Palpable Not Palpable  Gastrointestinal: soft, non-distended. No guarding/no peritoneal signs.  Musculoskeletal: M/S 5/5 throughout.  No visible deformity.  Neurologic: CN 2-12 intact. Pain and light touch intact in extremities.  Symmetrical.  Speech is fluent. Motor exam as listed above. Psychiatric: Judgment intact, Mood & affect appropriate for pt's clinical situation. Dermatologic: No rashes or ulcers noted.  No changes consistent with cellulitis.   CBC Lab Results  Component Value Date   WBC 7.1 10/05/2023   HGB 12.5 (L) 10/05/2023   HCT 36.9 (L) 10/05/2023   MCV 88.1 10/05/2023   PLT 116 (L) 10/05/2023    BMET    Component Value Date/Time   NA 141 01/03/2024 1208   K 4.8 01/03/2024 1208   CL 106 01/03/2024 1208   CO2 25 01/03/2024 1208   GLUCOSE 91 01/03/2024 1208   GLUCOSE 88 10/05/2023 0913   BUN 13 01/03/2024 1208   CREATININE 0.93 01/03/2024 1208   CREATININE 0.98 04/02/2012 0848   CALCIUM 9.9 01/03/2024 1208   GFRNONAA >60 10/05/2023 0913   GFRNONAA >60 04/02/2012 0848   GFRAA >60 07/25/2017 0551   GFRAA >60 04/02/2012 0848   Estimated Creatinine Clearance: 56.2  mL/min (by C-G formula based on SCr of 0.93 mg/dL).  COAG Lab Results  Component Value Date   INR 0.93 07/18/2017    Radiology ECHOCARDIOGRAM COMPLETE Result Date: 12/18/2023    ECHOCARDIOGRAM REPORT   Patient Name:   TABER SWEETSER Date of Exam: 12/18/2023 Medical Rec #:  [id number]       Height:       66.0 in Accession #:    [id number]      Weight:       162.0 lb Date of Birth:  [birth year]        BSA:  1.828 m Patient Age:    83 years        BP:           147/74 mmHg Patient Gender: M               HR:           60 bpm. Exam Location:  [place] Procedure: 2D Echo, 3D Echo, Cardiac Doppler, Color Doppler and Strain Analysis            (Both Spectral and Color Flow Doppler were utilized during            procedure). Indications:    Z01.818 Encounter for other preprocedural examination  History:        Patient has no prior history of Echocardiogram examinations.                 PAD; Risk Factors:Dyslipidemia, Hypertension and Former Smoker.  Sonographer:    Doyal [place] MHA, BS, RDCS Referring Phys: [id number] BRIAN AGBOR-ETANG IMPRESSIONS  1. Left ventricular ejection fraction, by estimation, is 30 to 35%. Left ventricular ejection fraction by PLAX is 34 %. The left ventricle has moderately decreased function. The left ventricle demonstrates global hypokinesis. Left ventricular diastolic parameters are consistent with Grade I diastolic dysfunction (impaired relaxation). The average left ventricular global longitudinal strain is -14.0 %. The global longitudinal strain is abnormal.  2. Right ventricular systolic function is normal. The right ventricular size is normal. There is normal pulmonary artery systolic pressure. The estimated right ventricular systolic pressure is 29.0 mmHg.  3. The mitral valve is normal in structure. Mild to moderate mitral valve regurgitation. No evidence of mitral stenosis.  4. The aortic valve is normal in structure. Aortic valve regurgitation is moderate. Aortic valve  sclerosis/calcification is present, without any evidence of aortic stenosis.  5. The inferior vena cava is normal in size with greater than 50% respiratory variability, suggesting right atrial pressure of 3 mmHg. FINDINGS  Left Ventricle: Left ventricular ejection fraction, by estimation, is 30 to 35%. Left ventricular ejection fraction by PLAX is 34 %. The left ventricle has moderately decreased function. The left ventricle demonstrates global hypokinesis. The average left ventricular global longitudinal strain is -14.0 %. Strain was performed and the global longitudinal strain is abnormal. The left ventricular internal cavity size was normal in size. There is no left ventricular hypertrophy. Left ventricular diastolic parameters are consistent with Grade I diastolic dysfunction (impaired relaxation). Right Ventricle: The right ventricular size is normal. No increase in right ventricular wall thickness. Right ventricular systolic function is normal. There is normal pulmonary artery systolic pressure. The tricuspid regurgitant velocity is 2.45 m/s, and  with an assumed right atrial pressure of 5 mmHg, the estimated right ventricular systolic pressure is 29.0 mmHg. Left Atrium: Left atrial size was normal in size. Right Atrium: Right atrial size was normal in size. Pericardium: There is no evidence of pericardial effusion. Mitral Valve: The mitral valve is normal in structure. Mild to moderate mitral valve regurgitation. No evidence of mitral valve stenosis. Tricuspid Valve: The tricuspid valve is normal in structure. Tricuspid valve regurgitation is mild . No evidence of tricuspid stenosis. Aortic Valve: The aortic valve is normal in structure. Aortic valve regurgitation is moderate. Aortic valve sclerosis/calcification is present, without any evidence of aortic stenosis. Aortic valve mean gradient measures 6.0 mmHg. Aortic valve peak gradient measures 10.9 mmHg. Aortic valve area, by VTI measures 2.33 cm. Pulmonic  Valve: The pulmonic valve was normal in structure. Pulmonic  valve regurgitation is mild. No evidence of pulmonic stenosis. Aorta: The aortic root is normal in size and structure. Venous: The inferior vena cava is normal in size with greater than 50% respiratory variability, suggesting right atrial pressure of 3 mmHg. IAS/Shunts: No atrial level shunt detected by color flow Doppler. Additional Comments: 3D was performed not requiring image post processing on an independent workstation and was indeterminate.  LEFT VENTRICLE PLAX 2D LV EF:         Left            Diastology                ventricular     LV e' medial:  5.44 cm/s                ejection        LV e' lateral: 7.94 cm/s                fraction by                PLAX is 34      2D Longitudinal                %.              Strain LVIDd:         4.94 cm         2D Strain GLS   -14.0 % LVIDs:         4.13 cm         Avg: LV PW:         1.07 cm LV IVS:        1.10 cm LVOT diam:     2.20 cm LV SV:         86              3D Volume EF: LV SV Index:   47              3D EF:        41 % LVOT Area:     3.80 cm        LV EDV:       189 ml                                LV ESV:       111 ml                                LV SV:        78 ml RIGHT VENTRICLE RV Basal diam:  2.60 cm RV Mid diam:    1.97 cm RV S prime:     16.35 cm/s LEFT ATRIUM             Index        RIGHT ATRIUM           Index LA diam:        3.80 cm 2.08 cm/m   RA Area:     11.50 cm LA Vol (A2C):   27.4 ml 14.98 ml/m  RA Volume:   23.10 ml  12.63 ml/m LA Vol (A4C):   33.7 ml 18.43 ml/m LA Biplane Vol: 31.4 ml 17.17 ml/m  AORTIC VALVE AV Area (Vmax):    2.30 cm AV Area (Vmean):   2.34 cm  AV Area (VTI):     2.33 cm AV Vmax:           165.00 cm/s AV Vmean:          108.000 cm/s AV VTI:            0.368 m AV Peak Grad:      10.9 mmHg AV Mean Grad:      6.0 mmHg LVOT Vmax:         100.00 cm/s LVOT Vmean:        66.600 cm/s LVOT VTI:          0.226 m LVOT/AV VTI ratio: 0.61  AORTA Ao Sinus  diam: 3.64 cm Ao Asc diam:   3.70 cm TRICUSPID VALVE TR Peak grad:   24.0 mmHg TR Vmax:        245.00 cm/s  SHUNTS Systemic VTI:  0.23 m Systemic Diam: 2.20 cm Evalene Lunger MD Electronically signed by Evalene Lunger MD Signature Date/Time: 12/18/2023/6:48:33 PM    Final      Assessment/Plan 1. Infrarenal abdominal aortic aneurysm (AAA) without rupture (Primary) Recommend:  Patient is status post successful endovascular repair of the AAA.    No further intervention is required at this time.   No endoleak is detected and the aneurysm sac is stable.   The patient will continue antiplatelet therapy as prescribed as well as aggressive management of hyperlipidemia. Exercise is encouraged.    However, endografts require continued surveillance with ultrasound or CT scan. This is mandatory to detect any changes that allow repressurization of the aneurysm sac.  The patient is informed that this would be asymptomatic.   The patient is reminded that lifelong routine surveillance is a necessity with an endograft. Patient will continue to follow-up at the specified interval with ultrasound of the aorta. - VAS US  AORTA/IVC/ILIACS; Future  2. PAD (peripheral artery disease) Recommend:   The patient has evidence of atherosclerosis of the lower extremities with claudication.  The patient does not voice lifestyle limiting changes at this point in time.   Noninvasive studies do not suggest clinically significant change.   No invasive studies, angiography or surgery at this time The patient should continue walking and begin a more formal exercise program.  The patient should continue antiplatelet therapy and aggressive treatment of the lipid abnormalities   No changes in the patient's medications at this time   Continued surveillance is indicated as atherosclerosis is likely to progress with time.     The patient will continue follow up with noninvasive studies as ordered.   3. Emphysema lung  (HCC) Continue pulmonary medications and aerosols as already ordered, these medications have been reviewed and there are no changes at this time.   4. Hyperlipidemia, unspecified hyperlipidemia type Continue statin as ordered and reviewed, no changes at this time    Cordella Shawl, MD  01/16/2024 8:56 AM      [1]  Current Meds  Medication Sig   acetaminophen  (TYLENOL ) 500 MG tablet Take 500 mg by mouth every 6 (six) hours as needed.    aspirin  EC 81 MG tablet Take 81 mg by mouth.   dorzolamide -timolol  (COSOPT ) 22.3-6.8 MG/ML ophthalmic solution Place 1 drop into both eyes 2 (two) times daily.   latanoprost  (XALATAN ) 0.005 % ophthalmic solution Place 1 drop into both eyes at bedtime.   losartan  (COZAAR ) 25 MG tablet Take 1 tablet (25 mg total) by mouth daily.   lovastatin (MEVACOR) 40 MG tablet Take 40 mg by mouth at  bedtime.   metoprolol  succinate (TOPROL  XL) 25 MG 24 hr tablet Take 1 tablet (25 mg total) by mouth daily.   metoprolol  tartrate (LOPRESSOR ) 25 MG tablet Take 1 tablet (25 mg total) by mouth once for 1 dose. Two hours before cardiac CT   tamsulosin  (FLOMAX ) 0.4 MG CAPS capsule Take 1 capsule (0.4 mg total) by mouth daily after supper.  [2]  Social History Tobacco Use   Smoking status: Former    Current packs/day: 0.00    Types: Cigarettes    Quit date: 07/28/2005    Years since quitting: 18.4   Smokeless tobacco: Never  Vaping Use   Vaping status: Never Used  Substance Use Topics   Alcohol use: No   Drug use: No  [3] No Known Allergies

## 2024-01-27 ENCOUNTER — Other Ambulatory Visit: Payer: Self-pay | Admitting: Cardiology

## 2024-01-31 ENCOUNTER — Telehealth (HOSPITAL_COMMUNITY): Payer: Self-pay | Admitting: *Deleted

## 2024-01-31 NOTE — Telephone Encounter (Signed)
 Reaching out to patient to offer assistance regarding upcoming cardiac imaging study; pt's wife answered phone and verbalizes understanding of appt date/time, parking situation and where to check in, pre-test NPO status and medications ordered, and verified current allergies; name and call back number provided for further questions should they arise  Chantal Requena RN Navigator Cardiac Imaging [facility] [phone] office [phone] cell

## 2024-01-31 NOTE — Telephone Encounter (Signed)
 Attempted to call patient regarding upcoming cardiac CT appointment. Left message on voicemail with name and callback number  Larey Brick RN Navigator Cardiac Imaging [facility] [phone] Office [phone] Cell

## 2024-02-03 ENCOUNTER — Ambulatory Visit
Admission: RE | Admit: 2024-02-03 | Discharge: 2024-02-03 | Disposition: A | Discharge status: Home / Self Care | Source: Ambulatory Visit | Attending: Cardiology | Admitting: Cardiology

## 2024-02-03 DIAGNOSIS — R072 Precordial pain: Secondary | ICD-10-CM | POA: Diagnosis not present

## 2024-02-03 MED ORDER — NITROGLYCERIN 0.4 MG SL SUBL
0.8000 mg | SUBLINGUAL_TABLET | Freq: Once | SUBLINGUAL | Status: AC
  Administered 2024-02-03: 0.8 mg via SUBLINGUAL
  Filled 2024-02-03: qty 25

## 2024-02-03 MED ORDER — IOHEXOL 350 MG/ML SOLN
100.0000 mL | Freq: Once | INTRAVENOUS | Status: AC | PRN
  Administered 2024-02-03: 100 mL via INTRAVENOUS

## 2024-02-03 NOTE — Progress Notes (Signed)
 Pt. Tolerated CTA well. Pt. Encouraged to drink lots of water today. VSS. Stable for DC home with wife.

## 2024-02-10 ENCOUNTER — Ambulatory Visit: Payer: Self-pay | Admitting: Cardiology

## 2024-03-05 ENCOUNTER — Ambulatory Visit: Admitting: Cardiology

## 2024-03-19 ENCOUNTER — Ambulatory Visit: Admitting: Cardiology

## 2024-05-22 ENCOUNTER — Ambulatory Visit: Admitting: Urology

## 2025-01-14 ENCOUNTER — Ambulatory Visit (INDEPENDENT_AMBULATORY_CARE_PROVIDER_SITE_OTHER): Admitting: Vascular Surgery

## 2025-01-14 ENCOUNTER — Encounter (INDEPENDENT_AMBULATORY_CARE_PROVIDER_SITE_OTHER)
